# Patient Record
Sex: Male | Born: 1980 | Hispanic: Yes | Marital: Married
Health system: Western US, Academic
[De-identification: ages and names within clinical notes are randomized; demographics above are authoritative.]

---

## 2016-08-07 ENCOUNTER — Telehealth (INDEPENDENT_AMBULATORY_CARE_PROVIDER_SITE_OTHER): Payer: Self-pay

## 2016-08-07 NOTE — Telephone Encounter (Signed)
New patient screening form for [facility] has been uploaded to Media. Please review and advise on scheduling.        Ernest Hernandez  [facility]

## 2016-08-08 NOTE — Telephone Encounter (Signed)
Called patient to schedule an appointment for [facility] approved by Courtney Sanchez left vm to call us back.    Thank You,  Sonia Martinez

## 2016-08-23 ENCOUNTER — Encounter (INDEPENDENT_AMBULATORY_CARE_PROVIDER_SITE_OTHER): Payer: TRICARE Prime—HMO

## 2016-08-23 NOTE — Interdisciplinary (Signed)
Psychiatric Intake    ID: Dionisios is a 35 year old, married, heterosexual, employed, active duty, [place] male with no significant psychiatric history.     HPI: Tadan presents to [facility] seeking medication management services; he was referred by Dr. Angelene Giovanni at the [address] [facility]. CLT reports a couple weeks ago, he was driving to [address] from [facility] after dropping his son off at preschool, and realized he no longer wants to be in the [employer]. He feels being in the [employer] is preventing him from completing school and caring for his family after he retires. Dr. Angelene Giovanni informed CLT he may have ADD, and referred him out for an evaluation and treatment. CLT reports he has always struggled to perform well in school and has difficulty focusing.    Chief Complaint: "Dr. Angelene Giovanni referred me here for an ADD evaluation."     Current Psychosocial Stressors: CLT has recently realized he does not want to "put in any effort" while he stays in the [facility], that he is only staying in to get to 20 years so his family can stay on healthcare. CLT is enrolling in school, but worried about how his performance will be.     CURRENT SYMPTOMS:  Depression: CLT reports he feels "happy" most of the time, but reports he has been less interested in going to the movies with his family, going to the park, and being around family members. Denies SI/plan/intent.  Mania: Denied.  Anxiety: CLT reports a lot of his anxiety is related to the symptoms he has experienced throughout his life (see ADD symptoms below). Over the last 2-3 years, he has been sleeping an average of 4-5 hours per night, waking up at midnight (after going to bed at 8 PM) and being unable to fall asleep again. CLT reports he may have had a panic attack in August 2016 when he experienced sweating, heart pounding, shortness of breath, chest pain, and fear something "was wrong"; he went to the ED and was told it may have been from anxiety since they did not  find anything wrong.  OCD: Denied.  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 CLT reports he has "worked through" a lot of his symptoms, no longer experiencing nightmares and having "less vivid" memories; "I still remember faces an injuries, but I don't remember all the details anymore. He does not believe PTSD is contributing to his current symptoms.  Eating Disorders: Denied.  Adjustment Disorder: CLT reports he has been experiencing symptoms throughout his life (which Dr. Angelene Giovanni labeled as possible ADD), but that recently he has been considering leaving in the [facility]. Will have to further assess.  ADD: CLT reports he has experienced these symptoms throughout his life, but had never considered they could be ADD until he met with Dr. Angelene Giovanni. He reports he grew up thinking he had a learning disability due to having to re-read things in school in order to comprehend "things that were easy for everyone else". He reports his mind wanders a lot, he has to write down things at work in order to keep track of tasks he has to do (carries a notebook with him at work due to getting in trouble in the past), is easily distracted, and that his wife gets upset with him "because I will be  looking at her but not comprehending what she is saying to me."      PAST PSYCHIATRIC HISTORY   Psychiatrist: Denied.  Previous Diagnoses: Denied.  Inpatient Hospitalizations: Denied. Reports he went to the ED 1x in August 2016 due to chest pain, but was told it was likely a panic attack.  Suicide Attempts: Denied.   Outpatient Care: CLT was seen 2-3x by a psychiatrist in the past for a PTSD evaluation.   Past Psychiatric Medication Trials: Denied.      ALCOHOL AND OTHER DRUG HISTORY   TOBACCO: Denied.  ALCOHOL: Drinks 2x/week, usually 2 beers.  IV DRUGS: Denied.  AMPHETAMINES:  Denied.  CAFFEINE: Denied.  CANNABIS: Denied.   COCAINE: Denied.  HALLUCINOGENS: Denied.   INHALANTS: Denied.  OPIOIDS: Denied.  PCP: Denied.   SEDATIVE / HYPNOTICS: Denied.  ECSTASY: Denied.   GHB: Denied.  KETAMINE: Denied.  SOBRIETY AND TREATMENTS: N/A     MEDICAL HISTORY: Ongoing pain in his legs from stress fractures.   Medications/supplements: Motrin and Tylenol, organic protein.     SOCIAL HISTORY   RELATIONSHIPS: CLT has been married for 11 years, and they have 4 children together (sons ages 13, 19, and 35, and a daughter age 75).  SOCIAL SUPPORT: CLT reports his family is his main support system.  EDUCATION: Graduated high school, reports he did not do well. Some college credits and completed EMT basic training.  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  ABUSE/VIOLENCE: Denied.     Family/Social History: CLT was born in [place], grew up in the [place] area until he was 7, then moved to [place], [place]. He has an older brother and older sister, and a younger sister. His parents are still together, and live in [place] ([place] remains close to them). CLT reports his childhood was good, and denies experiencing emotional, physical, or sexual abuse. CLT has been married for 11 years, and has 4 children (ages 57, 23, 38, and 1). CLT was raised [place], but does not currently identify with any religion.    Family Psych History:  Half-sister - Schizophrenia and depression  Brother - alcohol and drug use problem    MILITARY HISTORY: Dorwin has been in the [employer] for 14 years, and holds the rank of E6. He is currently stationed at [address], and he works at a clinic; he does Data processing manager work, Human resources officer. 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  He has no history  of disciplinary action or FAP cases. His current contract ends in December 2018, but he plans to stay in for 20 years.  Firearms: No access to weapons/live fire at work. No personal firearms.    Mental Status Exam: Timm is a [place] male. He was 5 mins late for his appointment, engaged in session, appeared Ox4. Average height, slightly overweight, casually dressed. Appears stated age. No psychomotor abnormalities noted. Mood: "happy overall" with euthymic affect. Speech: Normal RRTV. TC: negative for SI/HI/AVH/delusions/paranoia. TP: LLGD. Insight/judgment: good/good.     Assessment: Gavinn is a 35 year old [place] American male with Unspecified Anxiety DO (rule out ADD, rule out Adjustment Disorder). CLT has a family history of mental illness which could indicate a biological predisposition to his symptoms. His symptoms have occurred  throughout the course of his life, but have worsened in the context of concern about his future and caring for his family. CLT has good insight into his symptoms and their impact on his daily functioning.    Plan: CLT is scheduled for medication management services with Dr. Leana Roe on 08/24/16. Reviewed with CLT when to call 911/go to ED. Patient was counseled and understands he can call 911 or go to any emergency room if he is in psychiatric crisis.  All risks/benefits/alternatives of treatment at [facility] discussed and agreed to. CLT is in agreement with current plan.

## 2016-08-24 ENCOUNTER — Ambulatory Visit (INDEPENDENT_AMBULATORY_CARE_PROVIDER_SITE_OTHER): Payer: TRICARE Prime—HMO | Admitting: Student in an Organized Health Care Education/Training Program

## 2016-08-24 VITALS — BP 129/78 | HR 57 | Resp 16

## 2016-08-24 DIAGNOSIS — F4321 Adjustment disorder with depressed mood: Secondary | ICD-10-CM

## 2016-08-24 NOTE — Progress Notes (Signed)
Attending Psychiatrist Attestation Note 08/24/16    This psychiatric service is being provided under [facility]'s Resident Supervisory Model. Under this model, a fully licensed Psychiatry Resident Physician (MD/DO) is providing medical service under a supervising Attending Psychiatrist. The Supervisory Model is a team approach in an academic center which provides comprehensive and empirically supported care to patients. Under this model of care, I have access to the medical records, and was available on-site to discuss the patient visit with the resident and to see the patient if indicated.     I have seen this patient and have performed the critical elements of the history, presentation, progress, diagnosis and mental status examination in order to confirm the resident physician's findings. I have reviewed the history, presentation, progress  and diagnosis of this patient with the resident physician. I participated in the medical decision making with the resident physician.  I agree with the findings and I agree with the plan as documented.        Anne Bird, MD  Attending Staff Psychiatrist  [facility] outpatient services

## 2016-08-24 NOTE — Progress Notes (Signed)
PGY-3 Psychiatry Resident Intake Appointment    This psychiatric service is being provided under [facility]'s Resident Supervisory Model. Under this model, a fully licensed Psychiatry Resident Physician (MD/DO) is providing medical service under a supervising Attending Psychiatrist. The Supervisory Model is a team approach in an academic center which provides comprehensive and empirically supported care to patients. Under this model of care, a supervising Attending Psychiatrist oversees care, accesses medical records, is available on-site in the clinic for consultation with the Psychiatry Resident Physician, and, as indicated may see the patient in person.    Patient name: Ernest Hernandez  Date:  08/24/16  Time: 90 minutes    Clinic: [facility]  Patient referred by: Dr. Nicoletta Ba     HISTORY     Chief Complaint: "i'm not sure. I was referred here by Dr. Nicoletta Ba at a [facility] branch clinic and he believes I may have ADD."  HPI: Ernest Hernandez is married with children (very supported), [employer], 35 year old male with no formal psychiatric history who is presenting for the abovbe chief complaint.   Patient is a good historian.    He went to the clinic bc he was feeling depressed and hopeless about the future. He's been in [employer] for 14 yrs. 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 Since being in a leadership role in 2008, he's been selfless and taking care of other people. He has not beeen able to take college classes. He feels depressed. He has anxiety. "never been diagnosed with it." his wife said she'd support pt in getting of military. But the pt decided to stay in the military for another 5 yrs to obtain the benefits for his family. He appealed to his chain of command to take some  responsibility off of his shoulders. He cried in front of his chief. That caused the referral to Dr. Nicoletta Ba. This all happened in August. He's felt like this for a long time. Before, they were more like passig thoughts until it all hit him in August. People see him as overly reliable. He's always the person that others go to because he does his job and does it right, so he gets tons of responsibility and ends up not being able to take care of himself. There are other people at his rank that don't get as much responsibility bc they're not as reliable. Before August, overall he felt happy at work. Now, he still puts on a happy face but internally feels bothered.     Ever since he was a kid, 2nd grade, he had difficulty in school. He had no reading comprehension. He believed he had a learning disability. He never remembered anything he read. He was in the slow reading classes in school. He's still a slow reader. Still has poor comprehension. He has to re-read multiple times today and in elementary school. His mind is wandering all the time. At work, he often takes 3X longer to do something than his peers when it comes to writing and reading. So he feels he has to put in lots of extra effort. He's always the last to finish exams. Grades in elementary school - C's and D's. Almost didn't graduate high school bc didn't pass an [place] course. He  felt that math was easy for him and he was confident in math. As a kid, his mind would sometimes wander when he watched TV. Even now, his mind wanders when he watches TV.     His wife yells at him a lot. When they're having a conversation, he often looks at her, hears the words, but doesn't retain / follow. This makes her very angry. This is not intentional. That's been going on the whole 11 yrs of their marriage. She assumes he's being lazy and not listening.   His father would often yell at him for being distractable as a kid when they were working together.   Words on a page do  not appeared jumbled or rearranged. He just can't comprehend.   He always carries a notebook. He writes everything down. He cannot do almost anything by memory alone.   Even when watching movies, he loses focus. He   Last time he felt happy was before [place], in 2004. He was a changed man after coming back. He stopped calling his mother. He's felt unhappy and anxious since then. He saw a psychiatrist 2-3x for "PTSD." he's been happy about 70-80% of the time happy after [place]. He still feels happy about "high 80s"% of the time.   No nightmares. No flashbacks. Does have memories, passing, not intrusive. He feels uncomfortable in public places. He feels "suffocated and over-crowded" iin public places. But not when he's with family.   Sleep; does breathing exercises before bedtime. Was previously sleeping 3-4hrs/night, regardless of his mood. He now sleeps about 6-7hrs/night.   Anhedonia; still actively enjoys fishing and camping and being with his wife. Enjoys being with his kids and his wife. They make his bad days better.   Appetite; good.   He was a quiet kid. Real mild. Not hyperactive.   Denies SI/HI/AVH. Denies any target sx of mania/hypomania.     History of congenital heart disease: denies   History of sudden cardiac death or early heart disease in family: denies   Any history of stimulant use/abuse: denies   Any history of high blood pressure: denies   Any history of heart disease in the patient: denies     The following is adapted and updated from Bethann Berkshireourtney Sanchez' LCSW navy intake note..I have verified this history, to the best of the patient's ability, during this encounter.         PAST PSYCHIATRIC HISTORY   Psychiatrist: Denied.  Previous Diagnoses: Denied.  Inpatient Hospitalizations: Denied. Reports he went to the ED 1x in August 2016 due to chest pain, but was told it was likely a panic attack.  Suicide Attempts: Denied.   Outpatient Care: CLT was seen 2-3x by a psychiatrist in the past for a PTSD  evaluation.   Past Psychiatric Medication Trials: Denied.     ALCOHOL AND OTHER DRUG HISTORY   TOBACCO: Denied.  ALCOHOL: Drinks 2x/week, usually 2 beers.  IV DRUGS: Denied.  AMPHETAMINES: Denied.  CAFFEINE: Denied.  CANNABIS: Denied.   COCAINE: Denied.  HALLUCINOGENS: Denied.   INHALANTS: Denied.  OPIOIDS: Denied.  PCP: Denied.   SEDATIVE / HYPNOTICS: Denied.  ECSTASY: Denied.   GHB: Denied.  KETAMINE: Denied.  SOBRIETY AND TREATMENTS: N/A    MEDICAL HISTORY: Ongoing pain in his legs from stress fractures.   Medications/supplements: Motrin and Tylenol, organic protein.    SOCIAL HISTORY   RELATIONSHIPS: CLT has been married for 11 years, and they have 4 children together (sons ages 599, 726, and  4, and a daughter age 70).  SOCIAL SUPPORT: CLT reports his family is his main support system.  EDUCATION: Graduated high school, reports he did not do well. Some college credits and completed EMT basic training.  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  ABUSE/VIOLENCE: Denied.    Family/Social History: CLT was born in [place], grew up in the [place] area until he was 7, then moved to [place], [place]. He has an older brother and older sister, and a younger sister. His parents are still together, and live in [place] (CLT remains close to them). CLT reports his childhood was good, and denies experiencing emotional, physical, or sexual abuse. CLT has been married for 11 years, and has 4 children (ages 72, 22, 68, and 1). CLT was raised [place], but does not currently identify with any religion.    Family Psych History:  Half-sister - Schizophrenia and depression  Brother - alcohol and drug use problem  No history of ADD.     MILITARY HISTORY: Daksh has been in the [employer] for 14 years, and holds the rank of E6. He is currently stationed at [address], and he works at a clinic; he does Designer, industrial/product work, Surveyor, minerals. He has been on 5 deployments, spending 7 months on the ground  in [place] in 2004 (seeing and treating casualties), 5 months at  [employer] treating prisoners, on the ground in [place] in 2010, and on the ship in [place] and the [place] previously.  He has no history of disciplinary action or FAP cases. His current contract ends in December 2018, but he plans to stay in for 20 years.  Firearms: No access to weapons/live fire at work. No personal firearms.      PMH: high cholesterol, MSK leg pain   There is no problem list on file for this patient.      PSH:  No past surgical history on file.    Current medications: denies    No current outpatient prescriptions on file prior to visit.     No current facility-administered medications on file prior to visit.      Allergies: NKDA   Allergies not on file    Review of Systems: See HPI. Otherwise, complete 10 point review of systems was negative.     PSYCHIATRIC SPECIALTY EXAMINATION     Vital Signs:   Blood pressure 129/78, pulse 57, resp. rate 16, SpO2 97 %.    Psychometric Scales:  No flowsheet data found.  No flowsheet data found.    Mental Status Examination:   Appearance: well-kempt, hygienic, appears stated age     Behavior: calm, cooperative     Motor/Abnormal Involuntary Movements: none     Gait: No abnormality     Speech: fluent, articulate, spontaneous, somewhat monotone    Mood: " fine "   Affect: constricted    Thought Process: linear     Associations: logical      Thought Content: denies SI/HI    Perceptions: denies AVH    Insight/Judgment: fair / grossly unimpaired presently     Orientation: A/O x4    Memory: recent and remote memory grossly intact    Attention/Concentration: no gross abnormalities    Language:  average based on verbal fluency and interaction    Fund of knowledge and Intellect: average based on interview and verbal fluency      Pertinent Studies/Labs: none      MEDICAL DECISION MAKING  Assessment:   Tyus Kallam is married with children (very supported), [employer], 35 year old male with  no formal psychiatric history who is presenting for the abovbe chief complaint.     Attention difficulty. Perhaps a learning disability, of which poor focus is a consequence. Can't r/o ADD. Needs neuropsych testing.   Despite endorsing that he's happy 70-80% of the time, he seems constricted and his wife has mentioned that he seems emotionally numb. He learned to turn down emotions in [place] in order to function optimally and seems to still be operating in this manner. He opened up about his military experience today and was tearful for the 3rd time in 13 years. He also has significant work-related stress which peaked in August. He feels trapped in [employer] and committed to another, long 5 yrs in order for his family to reap the benefits.     No e/o psychosis, bipolarity, major depression, GAD, social anxiety, or personality disorder.     A comprehensive suicide risk assessment was performed and the patient was assessed to be at a low imminent risk of self-harm.  Modifiable risk factors include n/a.  Non-modifiable risk factors include male gender.  The patient also has protective factors of future life plans, coping skills, therapeutic relationships, responsibility to children and access to health care.    Diagnostic Impression:  Adjustment disorder with depression   R/o ADD, r/o learning disability   Does not meet full criteria for PTSD but may have related symptoms   HLD    Plan/Recommendations:  Intervention/Psychotherapy: supportive, empathic listening, will place on resident therapy wait list   Medication:   Pending further evaluation.   Labs/Radiology/Tests/Consultation: neuropsychological testing   Other: none     Informed Consent/Confidentiality: R/B/A and possible ASEs of prescribed treatments were discussed with the patient who consented to the treatment plan.  Limits of confidentiality were reviewed at the beginning of today's intake appt.     Disposition: Return Visit     Ashley Murrain, MD  [facility]    This psychiatric service has been provided under [facility]'s Resident Supervisory Model. The care has been discussed and the note reviewed by the supervising Attending Psychiatrist: Dr. Egbert Garibaldi. Please refer to attached addendum for the supervisor's attested note.

## 2016-08-28 ENCOUNTER — Encounter (HOSPITAL_BASED_OUTPATIENT_CLINIC_OR_DEPARTMENT_OTHER): Payer: Self-pay | Admitting: Student in an Organized Health Care Education/Training Program

## 2016-08-28 DIAGNOSIS — F09 Unspecified mental disorder due to known physiological condition: Secondary | ICD-10-CM

## 2016-08-28 DIAGNOSIS — F068 Other specified mental disorders due to known physiological condition: Secondary | ICD-10-CM

## 2016-08-28 DIAGNOSIS — F32A Depression, unspecified: Secondary | ICD-10-CM

## 2016-08-28 DIAGNOSIS — F329 Major depressive disorder, single episode, unspecified: Secondary | ICD-10-CM

## 2016-09-24 ENCOUNTER — Ambulatory Visit (INDEPENDENT_AMBULATORY_CARE_PROVIDER_SITE_OTHER): Payer: TRICARE Prime—HMO | Admitting: Student in an Organized Health Care Education/Training Program

## 2016-09-24 VITALS — BP 116/77 | HR 66 | Resp 16 | Wt 191.0 lb

## 2016-09-24 DIAGNOSIS — F4321 Adjustment disorder with depressed mood: Secondary | ICD-10-CM

## 2016-09-24 NOTE — Progress Notes (Signed)
PGY-3 Psychiatry Resident Follow Up Appointment    This psychiatric service is being provided under [place]'s Resident Supervisory Model. Under this model, a fully licensed Psychiatry Resident Physician (MD/DO) is providing medical service under a supervising Attending Psychiatrist. The Supervisory Model is a team approach in an academic center which provides comprehensive and empirically supported care to patients. Under this model of care, a supervising Attending Psychiatrist oversees care, accesses medical records, is available on-site in the clinic for consultation with the Psychiatry Resident Physician, and, as indicated may see the patient in person.    Patient name: Ernest Hernandez  Date: 09/24/16  Time: 60 minutes   Clinic: [facility]     HISTORY     Chief Complaint: irritability     HPI:  Ernest Hernandez is married with children (very supported), [employer], 35 year old male with adjustment disorder who is presenting for the abovbe chief complaint.     Pt has been very irritable at work lately. His workload has increased, despite him trying to advocate for himself to have it decreased. He is finding it difficult to get his mind off of work even at home. Denies significant anxiety. Mood has been a bit lower as well but the moment he enters his home and sees his children, his spirits lift and he's able to enjoy the daily family bike ride. Relationship with wife is stable. Pt's work day starts at 6:30am and ends at 4pm. Pt gets out by 4:15pm most days. Despite this, he continues to endorse problems with concentration. He is still very interested in therapy. No major issues with sleep.     Interal substances, alcohol, tobacco: denies  Review of Systems Update: See HPI. Otherwise, patient denies:  chest pain, dyspnea, palpitations, rash, unintentional weight loss / gain, constitutional symptoms.       Past Psychiatric, Family, & Social History Updates: See HPI. No additional changes.   Medications:  No  current outpatient prescriptions on file prior to visit.     No current facility-administered medications on file prior to visit.      Past Medical History:  Patient Active Problem List   Diagnosis   . Adjustment disorder with depressed mood     Allergies:   No Known Allergies  PSYCHIATRIC SPECIALTY EXAMINATION     Vital Signs:    Blood pressure 116/77, pulse 66, resp. rate 16, weight 86.6 kg (191 lb), SpO2 97 %.    Psychometric Scales:  [place] PHQ9 DEPRESSION QUESTIONNAIRE 08/24/2016   Interest 1   Depressed 1   Sleep 2   Energy 1   Appetite 2   Failure 1   Concentration 3   Movement 0   Suicide 0   Summary(Manual) --   Summary(Calculated) 11   Functional Somewhat difficult     GAD 7 08/24/2016   Feeling afraid as if something awful might happen 0   Being easily annoyed or irritable 1   Worrying too much about different things 1   If you checked off any problems, how difficult have these problems made it for you to do your job along with other people? Somewhat difficult   Feeling nervous, anxious or on edge 1   Trouble relaxing 1   Being so restless that it is hard to sit still 0   GAD7 Patient Total 5   Not being able to stop or control worrying 1       Mental Status Examination:   Appearance: well-kempt, hygienic, appears stated  age    Behavior: calm, cooperative    Motor/Abnormal Involuntary Movements: none    Gait: No abnormality    Speech: fluent, articulate, spontaneous   Mood: " irritable "   Affect: congruent but pleasant with author    Thought Process: linear    Associations: logical     Thought Content: denies SI/HI   Perceptions: denies AVH   Insight/Judgment: fair / grossly unimpaired presently    Orientation: A/O x4   Memory: recent and remote memory grossly intact   Attention/Concentration: no gross abnormalities   Language:  average based on verbal fluency and interaction   Fund of knowledge and Intellect: average based on interview and verbal fluency     Pertinent Studies/Labs: None.      MEDICAL DECISION  MAKING     Assessment:   Ernest Hernandez is married with children (very supported), [employer], 35 year old male with adjustment disorder who is presenting for the abovbe chief complaint.     Ongoing mood and irritability all related to [employer]. His mood lifts when he's at home. His relationships are stable. Still suspect adjustment disorder. He endorses difficulty with concentration but is getting out of work on time and getting to work on time. Still, given the lifelong history of cognitive sx, it's still indicated to pursue neuropsych testing for ADD vs learning disability. In the meantime, psychotherapy is indicated for the adjustment disorder, not medications. Pt is currently not scheduled to see a therapist until late December. I will see him for supportive psychotherapy in the meantime given the acute work stress and distress It is causing.     A comprehensive suicide risk assessment was performed and the patient was assessed to be at a low acute risk of self-harm.  Modifiable risk factors include n/a.  Non-modifiable risk factors include existing psychiatric diagnoses.  The patient also has protective factors of future life plans, coping skills, therapeutic relationships, responsibility to children and access to health care.    Diagnostic Impression:  Adjustment disorder with depression   R/o ADD, r/o learning disability   Does not meet full criteria for PTSD but may have related symptoms   HLD      Problem/Condition:  Clinical trajectory / status: stable, not at goal    Comorbidities: none interfering with tx     Plan/Recommendations:  Intervention/Psychotherapy: supportive, t will see author for psychotherapy in 2 wks   Medication:   None indicated     Labs/Radiology/Tests/Consultation: none    Other: none      Informed Consent/Confidentiality: R/B/A and possible ASEs of prescribed treatments were discussed with the patient who consented to the treatment plan.  Limits of confidentiality have previously  been reviewed.   Disposition: RTC 2 wks      Ashley Murrain, MD  [facility]    This psychiatric service has been provided under [place]'s Resident Supervisory Model. The care has been discussed and the note reviewed by the supervising Attending Psychiatrist: Dr. Chestine Spore. Please refer to attached addendum for the supervisor's attested note.    Attending Psychiatrist Attestation Note 09/24/16    This psychiatric service is being provided under [place]'s Resident Supervisory Model. Under this model, a fully licensed Psychiatry Resident Physician (MD/DO) is providing medical service under a supervising Attending Psychiatrist. The Supervisory Model is a team approach in an academic center which provides comprehensive and empirically supported care to patients. Under this model of care, I have access to the medical records, and was available on-site  to discuss the patient visit with the resident and to see the patient if indicated. Please see below for specific comments on the service being provided at today's session.    Additional comments:   I have reviewed the history, presentation, progress, and diagnosis, with the resident physician. I agree with the findings and plan as documented with additional comments made below, and I participated in the medical decision making with the resident physician.    Floy SabinaAshley Clark, MD  Attending Staff Psychiatrist  [facility]

## 2016-10-18 ENCOUNTER — Ambulatory Visit (INDEPENDENT_AMBULATORY_CARE_PROVIDER_SITE_OTHER): Payer: TRICARE Prime—HMO | Admitting: Student in an Organized Health Care Education/Training Program

## 2016-10-18 DIAGNOSIS — F5101 Primary insomnia: Secondary | ICD-10-CM

## 2016-10-18 DIAGNOSIS — F4321 Adjustment disorder with depressed mood: Secondary | ICD-10-CM

## 2016-10-18 MED ORDER — DOXEPIN HCL 10 MG OR CAPS
10.00 mg | ORAL_CAPSULE | Freq: Every evening | ORAL | 0 refills | Status: DC | PRN
Start: 2016-10-18 — End: 2016-12-28

## 2016-10-18 NOTE — Progress Notes (Signed)
PGY-3 Psychiatry Resident Follow Up Appointment    This psychiatric service is being provided under [place]'s Resident Supervisory Model. Under this model, a fully licensed Psychiatry Resident Physician (MD/DO) is providing medical service under a supervising Attending Psychiatrist. The Supervisory Model is a team approach in an academic center which provides comprehensive and empirically supported care to patients. Under this model of care, a supervising Attending Psychiatrist oversees care, accesses medical records, is available on-site in the clinic for consultation with the Psychiatry Resident Physician, and, as indicated may see the patient in person.    Patient name: Ernest Hernandez  Date: 10/18/16  Time: 60 minutes   Clinic: [facility]     HISTORY     Chief Complaint: medication management     HPI:   Ernest Hernandez with children (very supported), [employer] [phone],35 year oldmale with adjustment disorder who is presenting for the abovbe chief complaint.     Interval History   Work situation has improved. 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 Other co-workers now forced to pick up slack --> less pressure and work on patient. Irritability has improved significantly. Denies target sx of depression. 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 He's looking forward to this. Should still be challenging, but in a good way. Overall should be less work. Things going really well at home. Pt does endorse 2-3x/wk early insomnia, waking at 2/3AM and being unable to fall back to sleep. He is also unable to sleep in past 4:30am on the weekends. He is able to fall asleep easily. His energy level is "okay." he does not fall asleep in the middle of hte day.   Interal substances, alcohol, tobacco: denies   Review of Systems Update: See HPI. Otherwise, patient denies:  chest pain, dyspnea, palpitations, rash, unintentional weight loss / gain, constitutional symptoms.       Past Psychiatric, Family, & Social History Updates:  See HPI. No additional changes.   Medications:  No current outpatient prescriptions on file prior to visit.     No current facility-administered medications on file prior to visit.      Past Medical History:  Patient Active Problem List   Diagnosis   . Adjustment disorder with depressed mood     Allergies:   No Known Allergies  PSYCHIATRIC SPECIALTY EXAMINATION     Vital Signs:    There were no vitals taken for this visit.    Psychometric Scales:  [place] PHQ9 DEPRESSION QUESTIONNAIRE 08/24/2016   Interest 1   Depressed 1   Sleep 2   Energy 1   Appetite 2   Failure 1   Concentration 3   Movement 0   Suicide 0   Summary(Manual) --   Summary(Calculated) 11   Functional Somewhat difficult     GAD 7 08/24/2016   Feeling afraid as if something awful might happen 0   Being easily annoyed or irritable 1   Worrying too much about different things 1   If you checked off any problems, how difficult have these problems made it for you to do your job along with other people? Somewhat difficult   Feeling nervous, anxious or on edge 1   Trouble relaxing 1   Being so restless that it is hard to sit still 0   GAD7 Patient Total 5   Not being able to stop or control worrying 1       Mental Status Examination:   Appearance: well-kempt, hygienic, appears stated age    Behavior:  calm, cooperative    Motor/Abnormal Involuntary Movements: none    Gait: No abnormality    Speech: fluent, articulate, spontaneous   Mood: " good "   Affect: euthymic   Thought Process: linear    Associations: logical     Thought Content: denies SI/HI   Perceptions: denies AVH   Insight/Judgment: good / grossly unimpaired presently    Orientation: A/O x4   Memory: recent and remote memory grossly intact   Attention/Concentration: no gross abnormalities   Language:  average based on verbal fluency and interaction   Fund of knowledge and Intellect: average based on interview and verbal fluency     Pertinent Studies/Labs: None.      MEDICAL DECISION MAKING          Assessment:     Ernest Hernandez with children (very supported), [employer] [phone],35 year oldmale with adjustment disorder who is presenting for the abovbe chief complaint.     Work stress has lessened, as have his primary symptoms of irritability and discontent. No e/o depression. Supports adjustment disorder diagnosis. Pt still endorses cognitive sx, wandering thoughts, poor concentration. However, has not been functionally impaired at work. Neuropsychological testing still pending. Will f/u on this. As for pt's intermittent late insomnia, he endorses not being able to tolerate OTC Benadryl. Doxepin may be of more benefit than trazodone in this case. Pt only anticipates using the medication a few times a week.     A comprehensive suicide risk assessment was performed and the patient was assessed to be at a low acute risk of self-harm.  Modifiable risk factors include none.  Non-modifiable risk factors include existing psychiatric diagnoses and male gender.  The patient also has protective factors of future life plans, coping skills, responsibility to children and access to health care.    Diagnostic Impression:  Adjustment disorder with depression   R/o ADD, r/o learning disability   Does not meet full criteria for PTSD but may have related symptoms   HLD      Problem/Condition:  Clinical trajectory / status: improving    Comorbidities: none interfering with treatment     Plan/Recommendations:  Intervention/Psychotherapy: psycho-education   -Will look into what is delaying the neuropsychological testing   Medication:   -Start doxepin 10 mg PRN insomnia     Labs/Radiology/Tests/Consultation: none    Other: none      Informed Consent/Confidentiality: R/B/A and possible ASEs of prescribed treatments were discussed with the patient who consented to the treatment plan.  Limits of confidentiality have previously been reviewed.   Disposition: RTC 2 months      Ashley Murrainerence Faisal Stradling, MD  [facility]    This  psychiatric service has been provided under [place]'s Resident Supervisory Model. The care has been discussed and the note reviewed by the supervising Attending Psychiatrist: Dr. Nelta NumbersAshbrook. Please refer to attached addendum for the supervisor's attested note.

## 2016-10-18 NOTE — Progress Notes (Signed)
Attending Psychiatrist Attestation Note 10/18/16    This psychiatric service is being provided under [place]'s Resident Supervisory Model. Under this model, a fully licensed Psychiatry Resident Physician (MD/DO) is providing medical service under a supervising Attending Psychiatrist. The Supervisory Model is a team approach in an academic center which provides comprehensive and empirically supported care to patients. Under this model of care, I have access to the medical records, and was available on-site to discuss the patient visit with the resident and to see the patient if indicated. Please see below for specific comments on the service being provided at today's session.    I have reviewed the history, presentation, progress, and diagnosis, with the resident physician. I agree with the findings and plan as documented with additional comments made below, and I participated in the medical decision making with the resident physician.    Additional comments: Agree with resident plan to initiate Doxepin 10mg  HS for insomnia.     Antionette Fairyharles D Ashbrook, MD  Attending Staff Psychiatrist  [facility]

## 2016-11-08 ENCOUNTER — Encounter (INDEPENDENT_AMBULATORY_CARE_PROVIDER_SITE_OTHER): Payer: Self-pay | Admitting: Psychology

## 2016-11-16 DIAGNOSIS — R4189 Other symptoms and signs involving cognitive functions and awareness: Secondary | ICD-10-CM

## 2016-11-16 DIAGNOSIS — F329 Major depressive disorder, single episode, unspecified: Secondary | ICD-10-CM

## 2016-11-16 DIAGNOSIS — F810 Specific reading disorder: Secondary | ICD-10-CM
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
## 2016-11-23 ENCOUNTER — Encounter (INDEPENDENT_AMBULATORY_CARE_PROVIDER_SITE_OTHER): Payer: TRICARE Prime—HMO | Admitting: Marriage & Family Therapist

## 2016-12-07 ENCOUNTER — Encounter (INDEPENDENT_AMBULATORY_CARE_PROVIDER_SITE_OTHER): Payer: TRICARE Prime—HMO | Admitting: Marriage & Family Therapist

## 2016-12-28 ENCOUNTER — Ambulatory Visit (INDEPENDENT_AMBULATORY_CARE_PROVIDER_SITE_OTHER): Payer: TRICARE Prime—HMO | Admitting: Student in an Organized Health Care Education/Training Program

## 2016-12-28 DIAGNOSIS — F819 Developmental disorder of scholastic skills, unspecified: Secondary | ICD-10-CM

## 2016-12-28 NOTE — Progress Notes (Signed)
PGY-3 Psychiatry Resident Follow Up Appointment    This psychiatric service is being provided under [facility] Resident Supervisory Model. Under this model, a fully licensed Psychiatry Resident Physician (MD/DO) is providing medical service under a supervising Attending Psychiatrist. The Supervisory Model is a team approach in an academic center which provides comprehensive and empirically supported care to patients. Under this model of care, a supervising Attending Psychiatrist oversees care, accesses medical records, is available on-site in the clinic for consultation with the Psychiatry Resident Physician, and, as indicated may see the patient in person.    Patient name: Ernest Hernandez  Date: 12/28/16  Time: 60 minutes   Clinic: [facility]     HISTORY     Chief Complaint: medication management, mood symptoms     HPI: 62M [employer], married with children, h/o learning disability and adjustment disorder. Here for the above.     Interval History   Work stress continues to have completely subsided. No target sx of depression or anxiety at all. Marriage and family life are going great. Work is going well, he's employing more boundaries. Completed neuropsych testing. dx'd with mild learning disability. Going to start classes in march. No major issues. He'd like to terminate his treatment here.   Medication side effects: n/a  Interal substances, alcohol, tobacco: denies   Review of Systems Update: See HPI. Otherwise, patient denies:  chest pain, dyspnea, palpitations, rash, unintentional weight loss / gain, constitutional symptoms.       Past Psychiatric, Family, & Social History Updates: See HPI. No additional changes.   Medications:  Current Outpatient Prescriptions on File Prior to Visit   Medication Sig Dispense Refill    doxepin (SINEQUAN) 10 MG capsule Take 1 capsule (10 mg) by mouth At bedtime as needed (insomnia). 30 capsule 0     No current facility-administered medications on file prior to visit.      Past  Medical History:  Patient Active Problem List   Diagnosis    Adjustment disorder with depressed mood     Allergies:   No Known Allergies  PSYCHIATRIC SPECIALTY EXAMINATION     Vital Signs:   Blood pressure 131/73, pulse 57, resp. rate 16, weight 90.7 kg (200 lb).    Psychometric Scales:  [place] PHQ9 DEPRESSION QUESTIONNAIRE 08/24/2016   Interest 1   Depressed 1   Sleep 2   Energy 1   Appetite 2   Failure 1   Concentration 3   Movement 0   Suicide 0   Summary(Manual) --   Summary(Calculated) 11   Functional Somewhat difficult     GAD 7 08/24/2016   Feeling afraid as if something awful might happen 0   Being easily annoyed or irritable 1   Worrying too much about different things 1   If you checked off any problems, how difficult have these problems made it for you to do your job along with other people? Somewhat difficult   Feeling nervous, anxious or on edge 1   Trouble relaxing 1   Being so restless that it is hard to sit still 0   GAD7 Patient Total 5   Not being able to stop or control worrying 1       Mental Status Examination:   Appearance: well-kempt, hygienic, appears stated age    Behavior: calm, cooperative    Motor/Abnormal Involuntary Movements: none    Gait: No abnormality    Speech: fluent, articulate, spontaneous   Mood: " great "   Affect: euthymic  Thought Process: linear    Associations: logical     Thought Content: denies SI/HI   Perceptions: denies AVH   Insight/Judgment: good / grossly unimpaired presently    Orientation: A/O x4   Memory: recent and remote memory grossly intact   Attention/Concentration: no gross abnormalities   Language:  average based on verbal fluency and interaction   Fund of knowledge and Intellect: average based on interview and verbal fluency     Pertinent Studies/Labs: None.      MEDICAL DECISION MAKING     Assessment:   Adjustment disorder as completely subsided. No e/o depression / anxiety. Psychosocially and relationally thriving. Neuropsychological testing confirmed  diagnosis of mild learning disability, with recommendations for accommodations elaborated in the note. This confirms author's clinical suspicion of learning disability. ADHD less likely. Patient would like to terminate treatment here, and author agrees that this is reasonable. We discussed his new diagnosis of learning disability and advocating for accommodations (testing, classroom) when he begins college classes.     A comprehensive suicide risk assessment was performed and the patient was assessed to be at a low acute risk of self-harm.  Modifiable risk factors include none.  Non-modifiable risk factors include male gender.  The patient also has protective factors of future life plans, coping skills, responsibility to children and access to health care.    Problem/Condition:  Clinical trajectory / status: stable, at goal    Comorbidities: no medical co-morbidities interfering with psychiatric treatment.     Diagnostic Impression:   Learning disability   Adjustment disorder with mixed anxiety/ depression - resolved     Plan/Recommendations:  Patient self-DC'd doxepin     Labs/Radiology/Tests/Consultation: none   Other: none     Risks / benefits / alternatives of the treatment plan including common and uncommon side-effects of medications were reviewed with the patient. The patient was able to repeat this information and consents to the treatment plan outlined above. The patient has decisional capacity to understand this RBA and give consent.    The patient understands they can contact OPS with non-urgent questions or concerns. The patient understands they can call 911 or go to an Emergency Department in the event of a psychiatric emergency.     Informed Consent/Confidentiality: R/B/A and possible ASEs of prescribed treatments were discussed with the patient who consented to the treatment plan.  Limits of confidentiality have previously been reviewed.   Disposition: terminating treatment at [facility]PS      Ashley Murrainerence Jayni Prescher,  MD  [facility]    This psychiatric service has been provided under [facility] Resident Supervisory Model. The care has been discussed and the note reviewed by the supervising Attending Psychiatrist: Dr. Egbert GaribaldiBird. Please refer to attached addendum for the supervisors attested note.

## 2016-12-28 NOTE — Progress Notes (Signed)
Attending Psychiatrist Attestation Note 12/28/16    This psychiatric service is being provided under [facility]’s Resident Supervisory Model. Under this model, a fully licensed Psychiatry Resident Physician (MD/DO) is providing medical service under a supervising Attending Psychiatrist. The Supervisory Model is a team approach in an academic center which provides comprehensive and empirically supported care to patients. Under this model of care, I have access to the medical records, and was available on-site to discuss the patient visit with the resident and to see the patient if indicated.           Ernest Balan, MD  Attending Staff Psychiatrist  [facility] outpatient services

## 2020-10-21 IMAGING — MR MRI HIP LT WO CONTRAST
6 series · 40 of 40 positions shown · non-contrast
Comparison: None.

INDICATION: Pain in left hip.
TECHNIQUE: Multiplanar, multiecho imaging of the left hip was performed, including T1-weighted and fluid sensitive sequences without intravenous contrast.

[Series 1: labral · axial · left · 6.0mm · 0.78mm/px · z∈[-36,+200]mm · 2 of 12 slices shown]
[im 1/12]
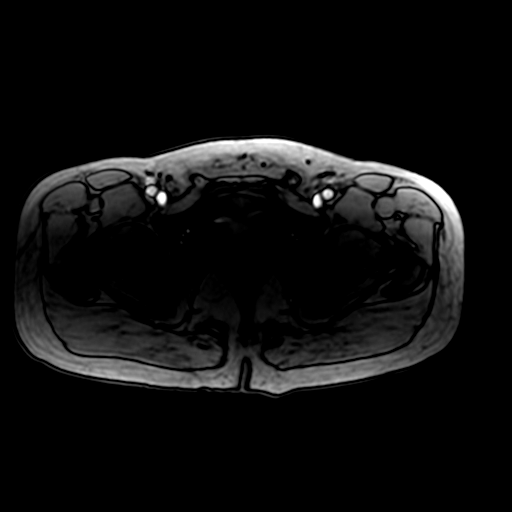
[im 12/12]
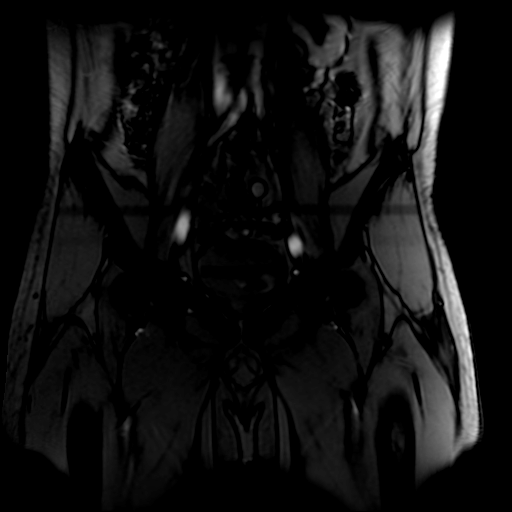

[Series 2: t1_cor_obl · coronal · left · 3.0mm · 0.47mm/px · 7 of 28 slices shown]
[im 1/28]
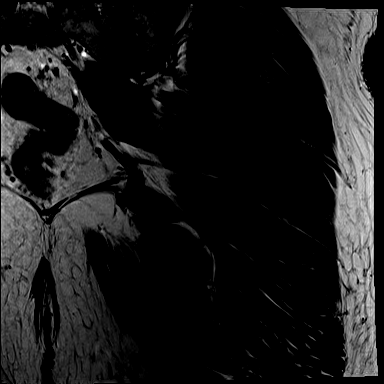
[im 5/28]
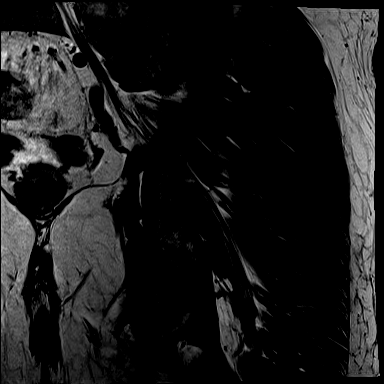
[im 10/28]
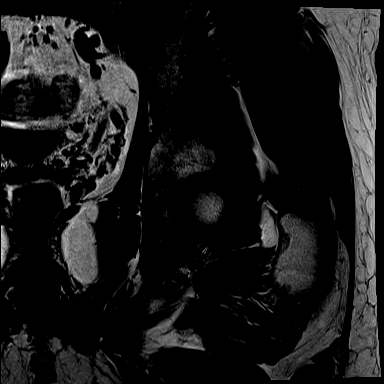
[im 14/28]
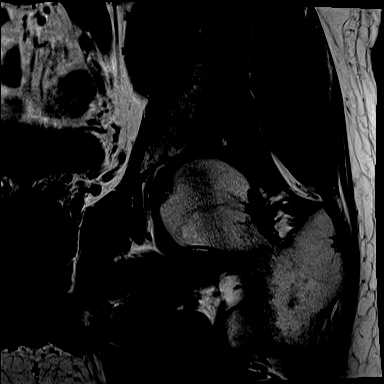
[im 19/28]
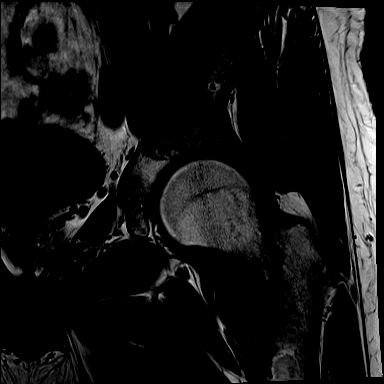
[im 23/28]
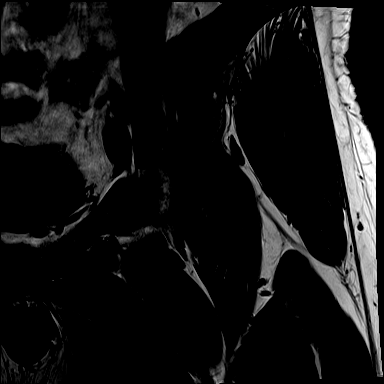
[im 28/28]
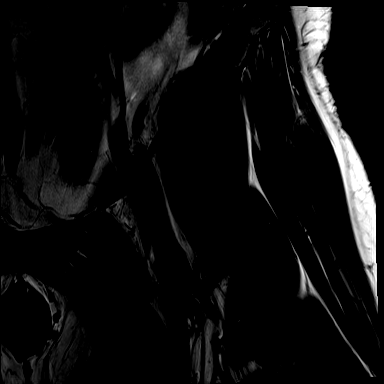

[Series 3: pd_cor_obl_fs · coronal · left · 3.0mm · 0.56mm/px · 7 of 28 slices shown]
[im 1/28]
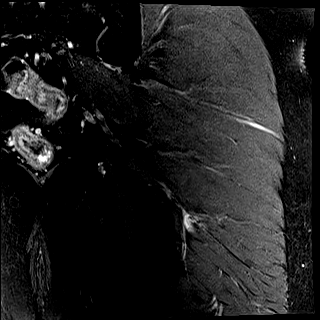
[im 5/28]
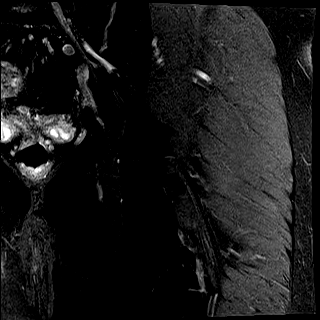
[im 10/28]
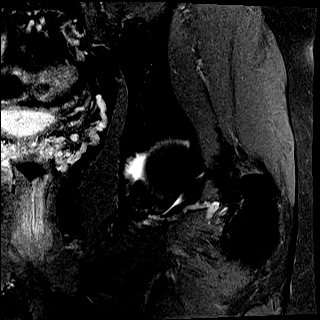
[im 14/28]
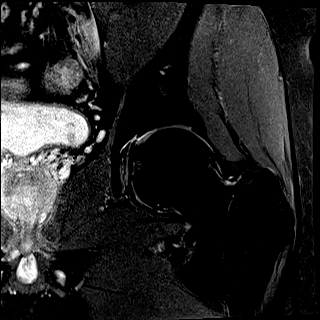
[im 19/28]
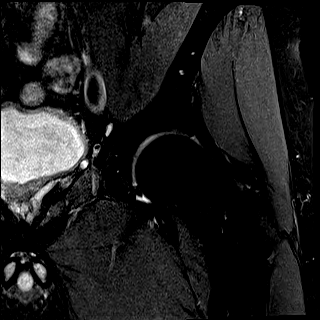
[im 23/28]
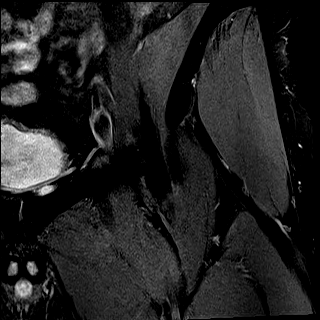
[im 28/28]
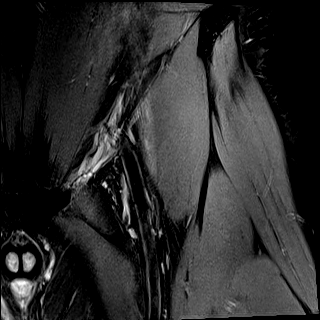

[Series 4: pd_sag_obl_fs · sagittal · left · 3.0mm · 0.56mm/px · 8 of 30 slices shown]
[im 1/30]
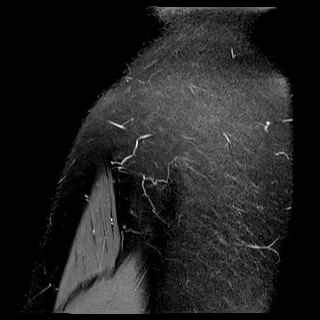
[im 5/30]
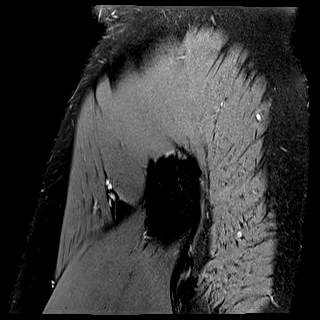
[im 9/30]
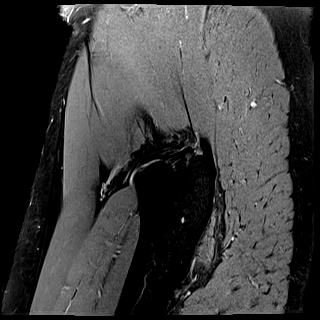
[im 13/30]
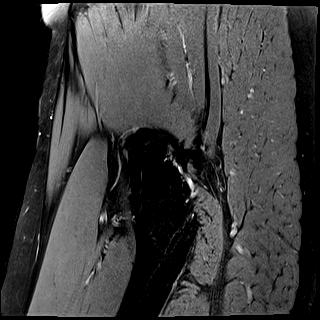
[im 17/30]
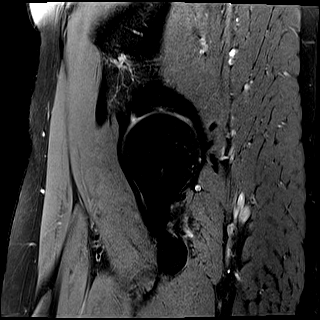
[im 21/30]
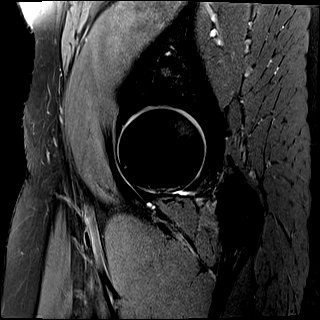
[im 25/30]
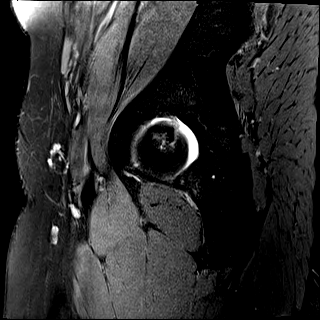
[im 30/30]
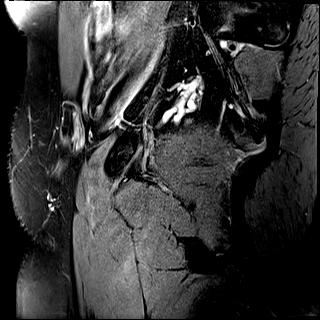

[Series 5: pd_axial_obl_fs · oblique · left · 3.0mm · 0.56mm/px · 6 of 24 slices shown]
[im 1/24]
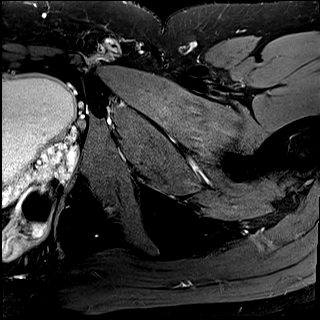
[im 5/24]
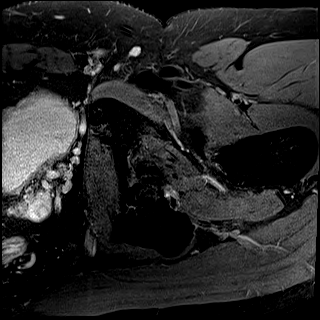
[im 10/24]
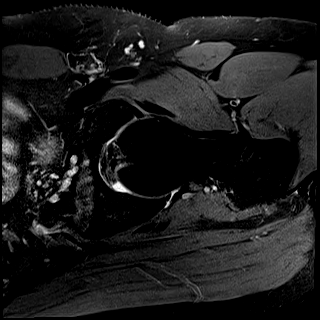
[im 14/24]
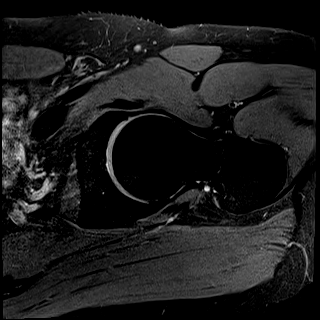
[im 19/24]
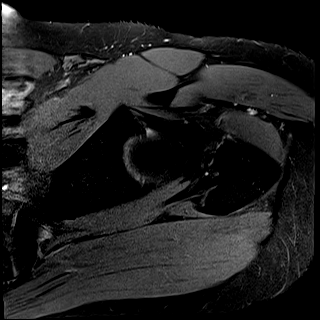
[im 24/24]
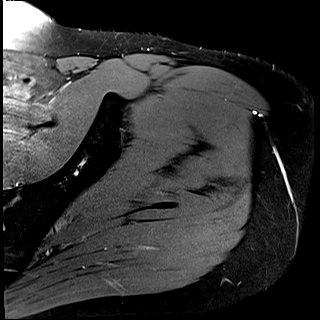

[Series 6: t2_axial_fs · axial · left · 4.0mm · 0.62mm/px · z∈[-105,+80]mm · 10 of 38 slices shown]
[im 1/38]
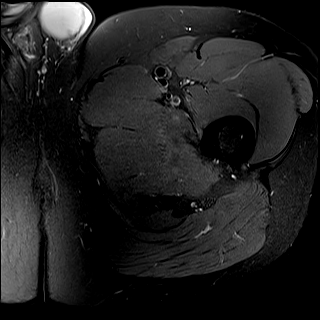
[im 5/38]
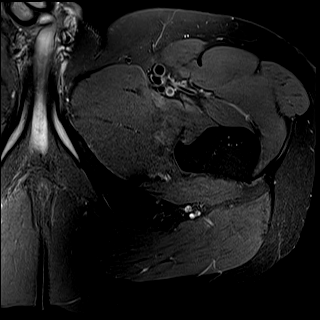
[im 9/38]
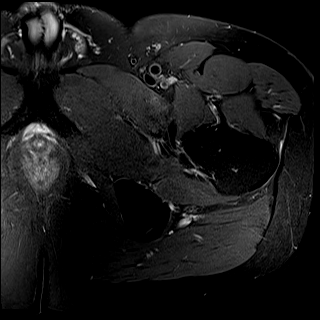
[im 13/38]
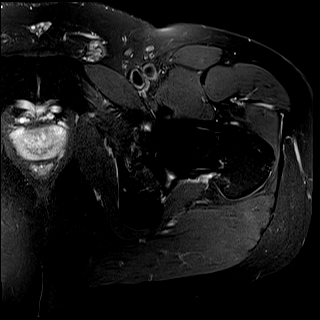
[im 17/38]
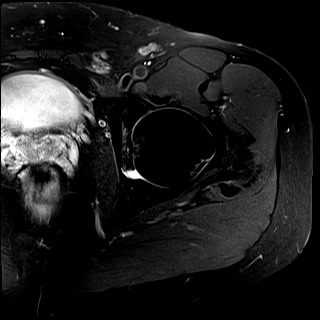
[im 21/38]
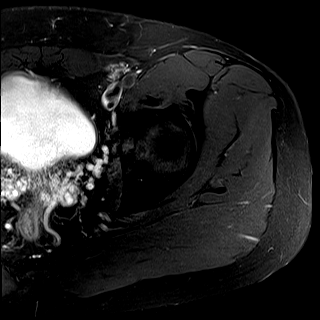
[im 25/38]
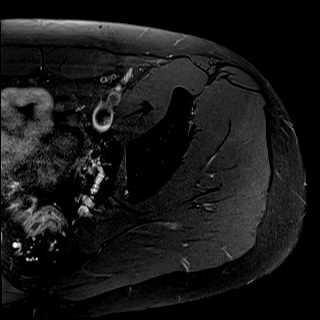
[im 29/38]
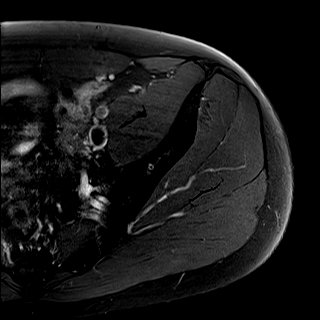
[im 33/38]
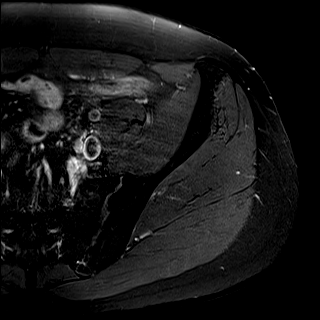
[im 38/38]
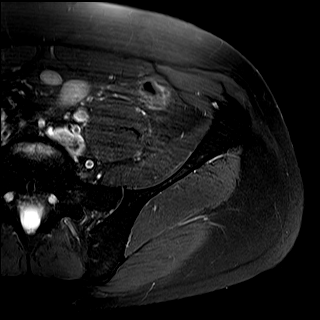

[40 of 40 positions shown; findings below may reference images not displayed]

FINDINGS: Acetabular labrum: Tear of the anterior superior left hip labrum.

Cartilage: Intact

Marrow: Subtle subchondral marrow edema at the posterior superior humeral head. No AVN. No erosion.

Osseous morphology: Cam morphology of the proximal left femur. Normal coverage of the femoral head by the acetabulum. Normal acetabular version.

Gluteal tendons and trochanteric bursa: Intact and normal in appearance.

Other visualized tendons: Other visualized tendons are intact and normal in appearance.

Soft tissues: No fluid collection. No soft tissue mass. No acute muscle injury.

No free fluid in the pelvis. No left inguinal adenopathy or hernia.
IMPRESSION: 1. Tear of the anterior superior left hip labrum.

2. No left hip chondral defects.

3. Mild subchondral marrow edema at the posterior superior humeral head, possible contusion versus stress related.

4. Sugey Kassam morphology of the proximal left femur.

## 2021-03-20 IMAGING — MR MRI SHOULDER LT WO CONTRAST
4 series · 31 of 40 positions shown · non-contrast
Comparison: None available.

INDICATION: Left shoulder pain .
TECHNIQUE: Multiplanar, multisequence MR imaging of the left shoulder without contrast.

[Series 5: t2_axial_fs · axial · left · 3.0mm · 0.47mm/px · z∈[-48,+48]mm · 9 of 25 slices shown]
[im 1/25]
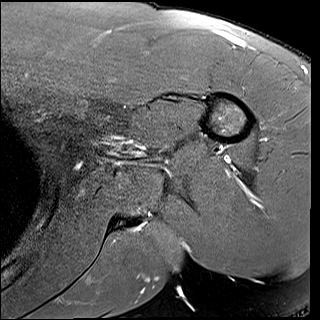
[im 4/25]
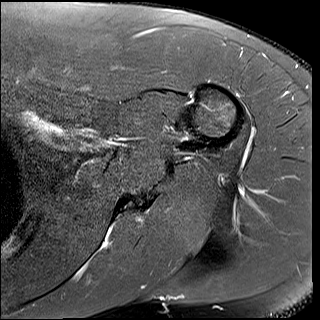
[im 7/25]
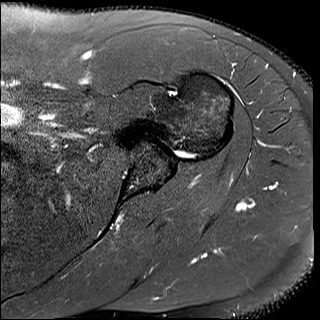
[im 10/25]
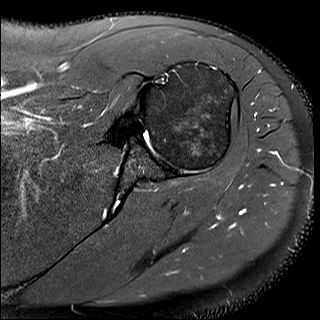
[im 13/25]
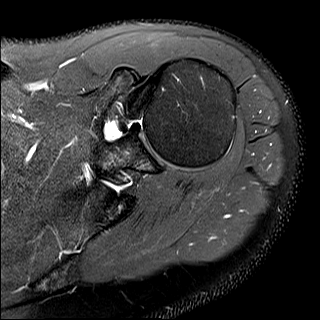
[im 16/25]
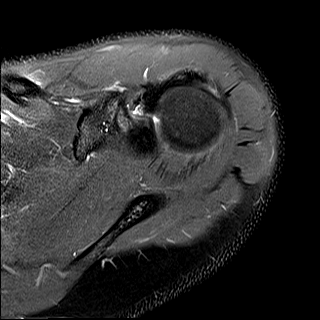
[im 19/25]
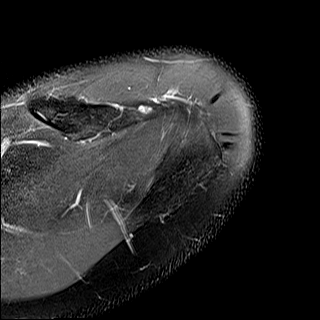
[im 22/25]
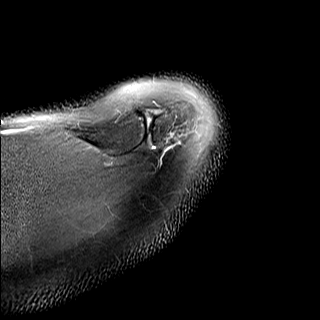
[im 25/25]
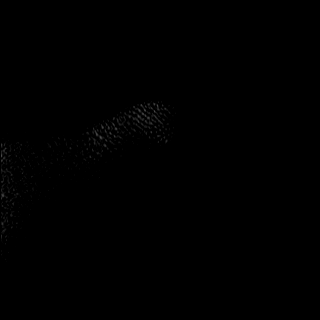

[Series 6: t2_cor_fs · oblique · left · 3.0mm · 0.50mm/px · 9 of 25 slices shown]
[im 1/25]
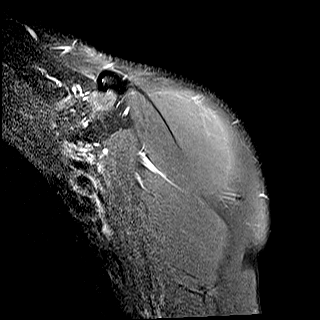
[im 4/25]
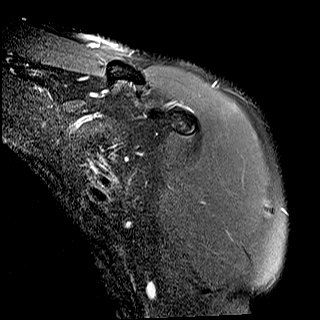
[im 7/25]
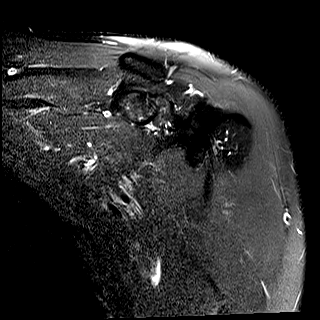
[im 10/25]
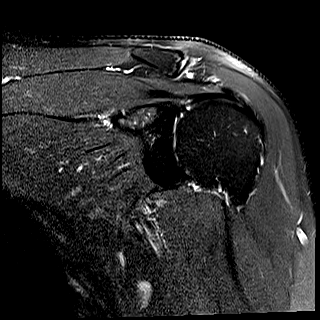
[im 13/25]
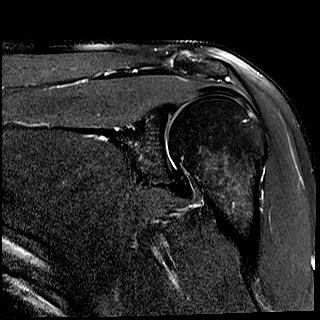
[im 16/25]
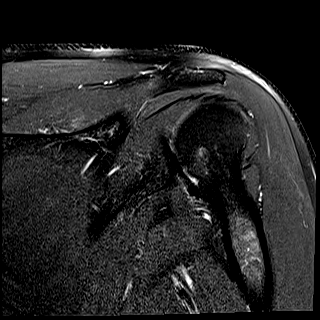
[im 19/25]
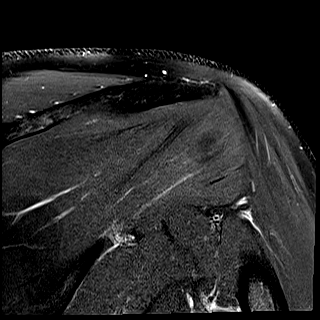
[im 22/25]
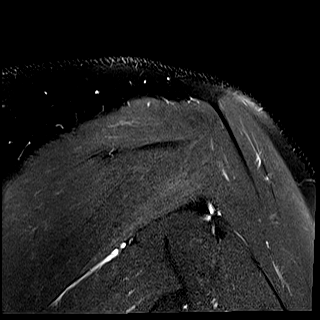
[im 25/25]
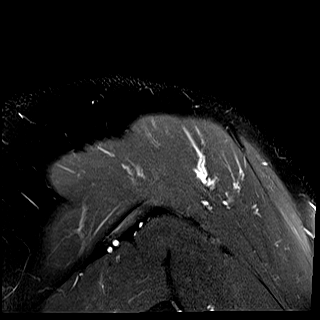

[Series 7: t2_sag_fs · oblique · left · 3.0mm · 0.31mm/px · 10 of 28 slices shown]
[im 1/28]
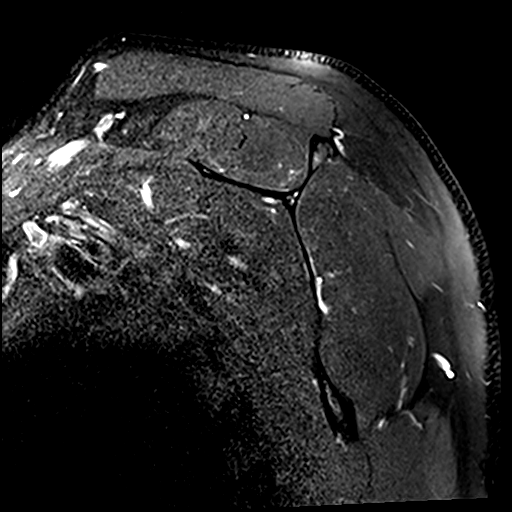
[im 3/28]
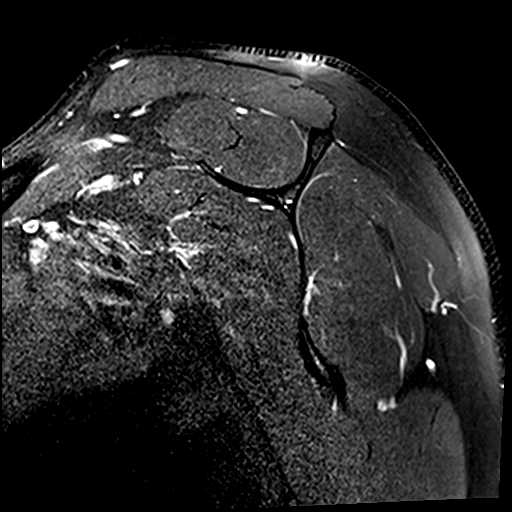
[im 6/28]
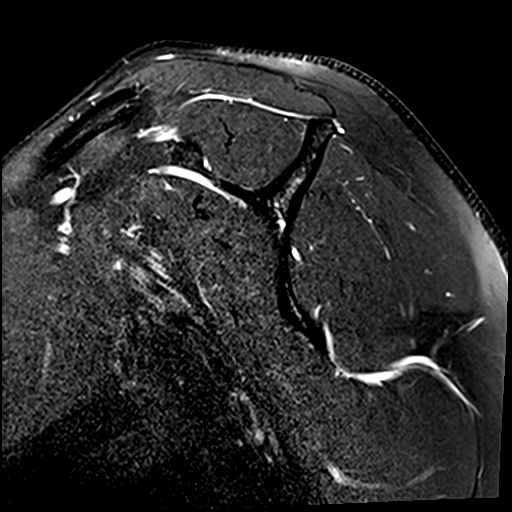
[im 9/28]
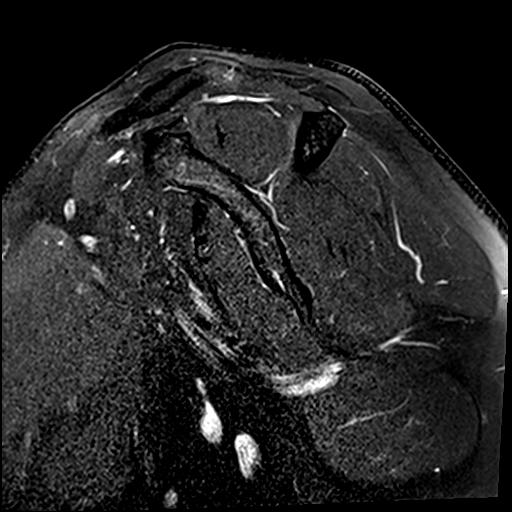
[im 11/28]
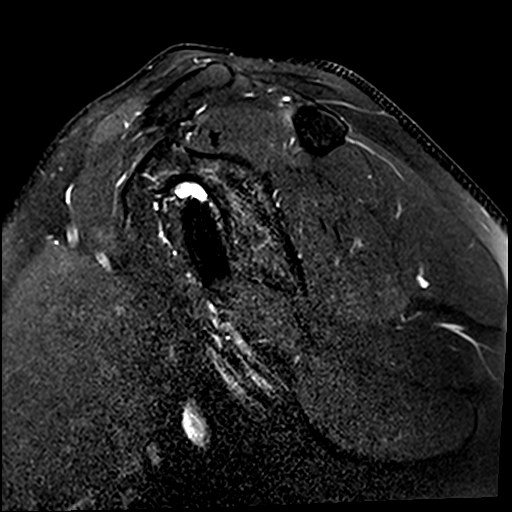
[im 14/28]
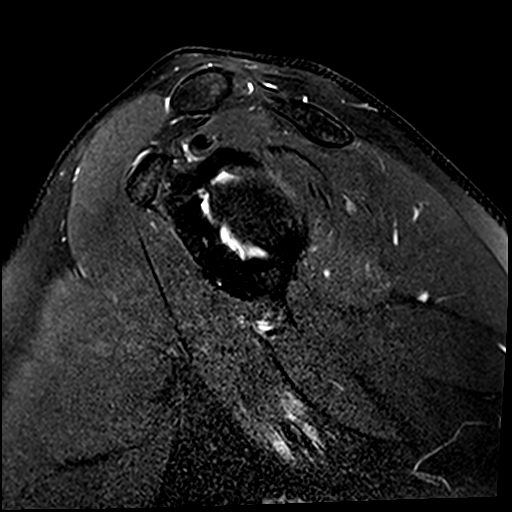
[im 17/28]
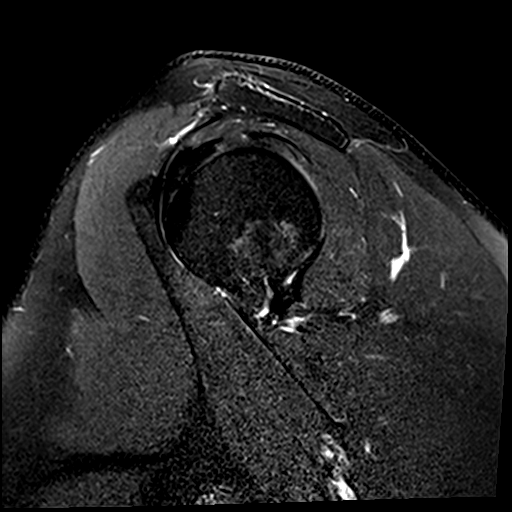
[im 19/28]
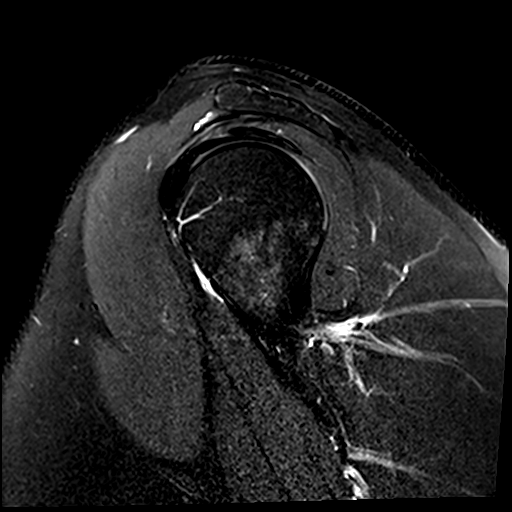
[im 22/28]
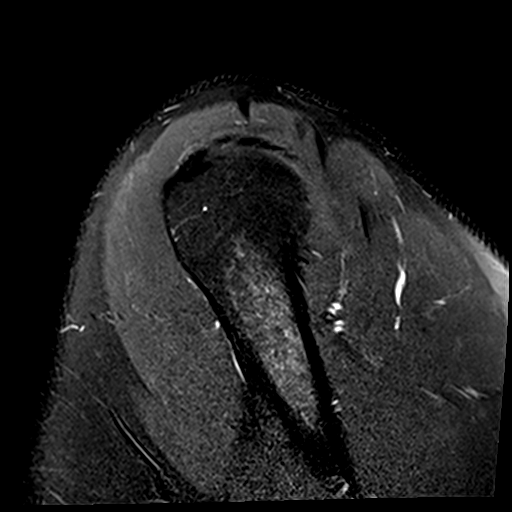
[im 25/28]
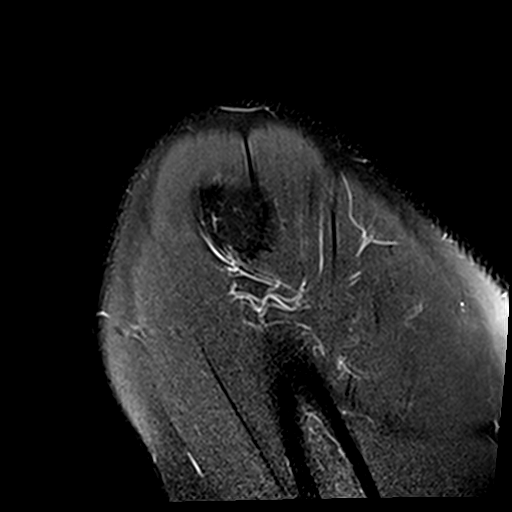

[Series 8: t1_sag · oblique · left · 3.0mm · 0.50mm/px · 3 of 28 slices shown]
[im 3/28]
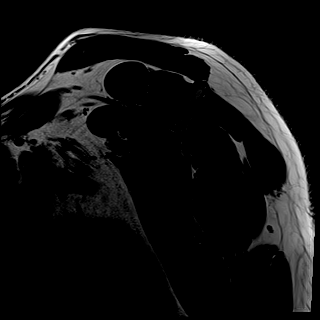
[im 14/28]
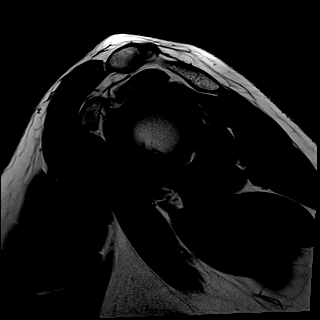
[im 25/28]
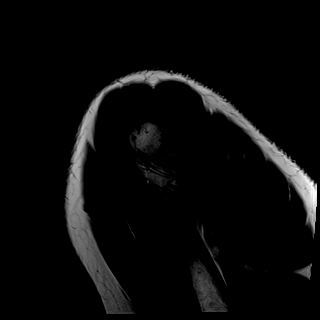

[31 of 40 positions shown; findings below may reference images not displayed]

FINDINGS: OSSEOUS: No acute fracture, avascular necrosis or aggressive osseous lesion. 

ACROMIAL OUTLET: Minimal osteoarthrosis of the acromioclavicular joint. A small  amount of fluid is present within the subacromial/subdeltoid bursa. The acromion is nonhooked. The coracoclavicular and coracoacromial ligaments are intact.

ROTATOR CUFF:

Supraspinatus and infraspinatus: Mild tendinosis at the insertion of the confluent supraspinatus/infraspinatus tendons. The supraspinatus and infraspinatus tendons are otherwise intact.

Teres minor: Intact.

Subscapularis: Intact.

Fatty atrophy: The rotator cuff muscles are maintained.

LABRUM: The labrum is intact without fluid-filled labral cleft or paralabral cyst.

BICEPS TENDON: The proximal intra-articular and extra-articular long head biceps tendon are intact.

GLENOHUMERAL JOINT: The glenohumeral articular cartilage is maintained. No focal chondral defect is identified. The glenohumeral joint is normal in alignment.

OTHER: The inferior glenohumeral ligament is unremarkable. No glenohumeral joint effusion.
IMPRESSION: 1.
Mild tendinosis of the confluent supraspinatus/infraspinatus tendons. No rotator cuff

tear.

2.
Minimal acromioclavicular joint osteoarthritis.

## 2021-03-20 IMAGING — MR MRI SHOULDER RT WO CONTRAST
4 series · 31 of 40 positions shown · non-contrast
Comparison: None available.

INDICATION: Pain in right shoulder
TECHNIQUE: Multiplanar, multisequence MR imaging of the right shoulder without contrast.

[Series 5: t2_axial_fs · axial · right · 3.0mm · 0.47mm/px · z∈[-39,+57]mm · 9 of 25 slices shown]
[im 1/25]
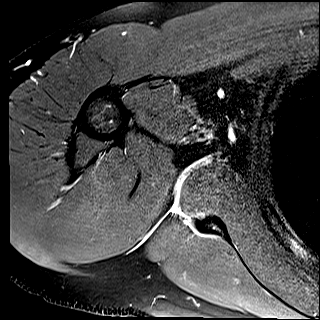
[im 4/25]
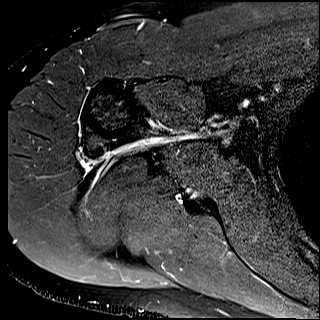
[im 7/25]
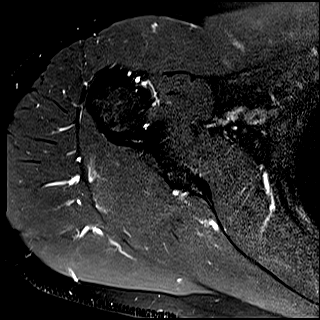
[im 10/25]
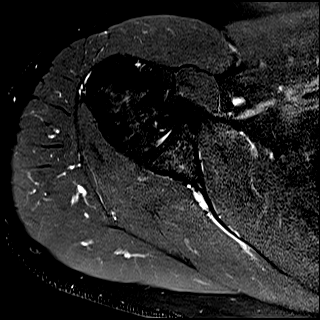
[im 13/25]
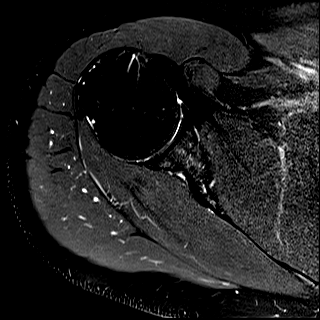
[im 16/25]
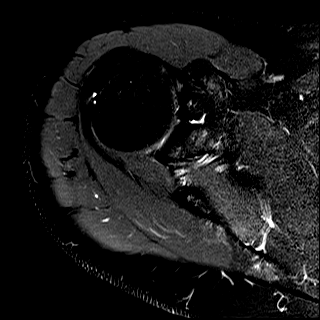
[im 19/25]
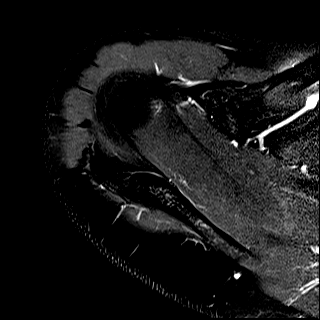
[im 22/25]
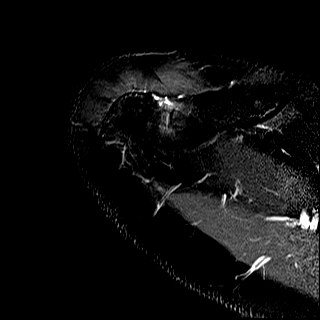
[im 25/25]
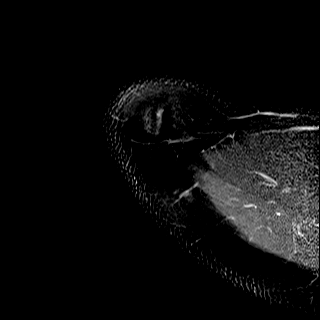

[Series 6: t2_cor_fs · oblique · right · 3.0mm · 0.50mm/px · 9 of 25 slices shown]
[im 1/25]
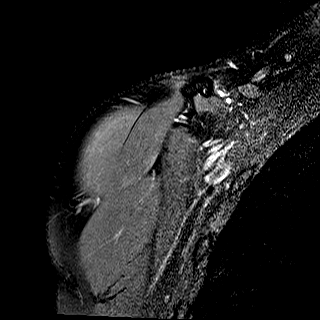
[im 4/25]
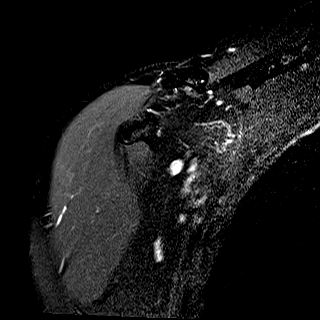
[im 7/25]
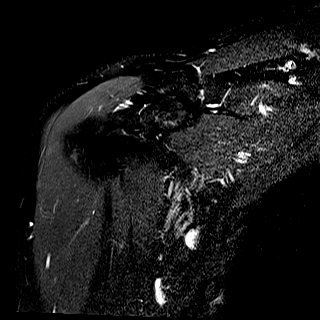
[im 10/25]
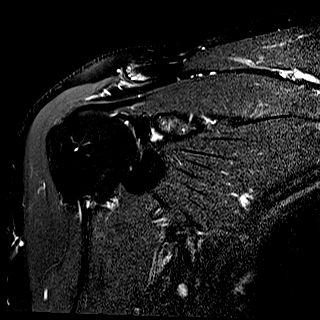
[im 13/25]
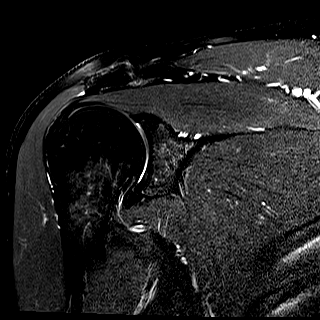
[im 16/25]
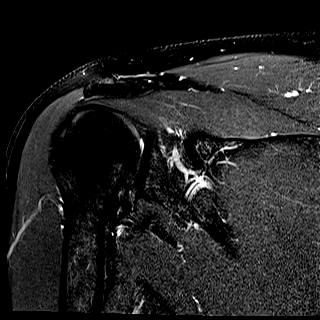
[im 19/25]
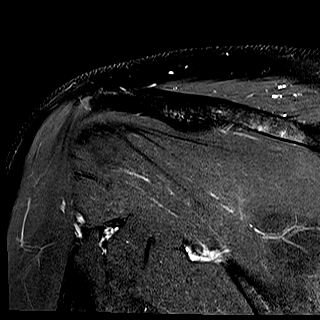
[im 22/25]
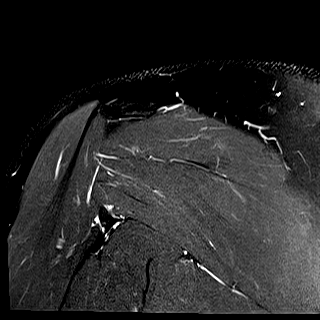
[im 25/25]
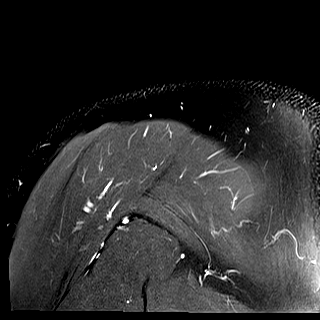

[Series 7: t2_sag_fs · oblique · right · 3.0mm · 0.31mm/px · 10 of 28 slices shown]
[im 1/28]
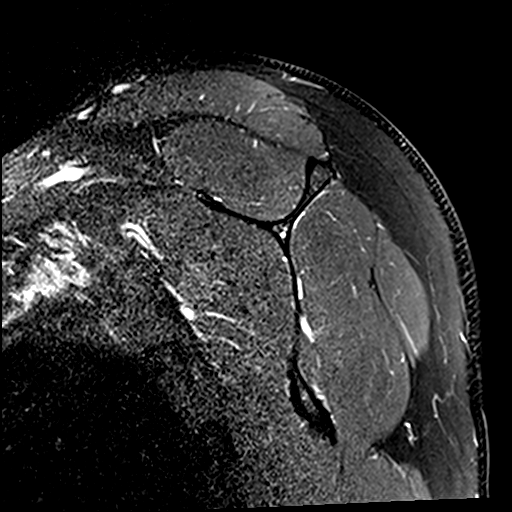
[im 3/28]
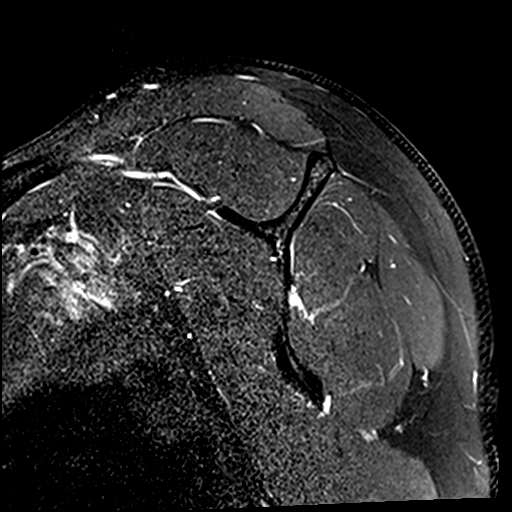
[im 6/28]
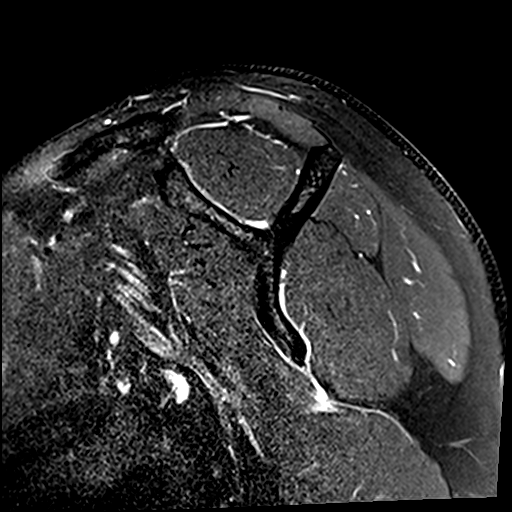
[im 9/28]
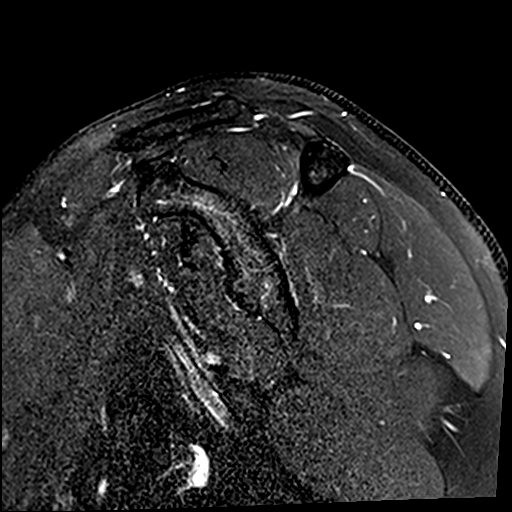
[im 11/28]
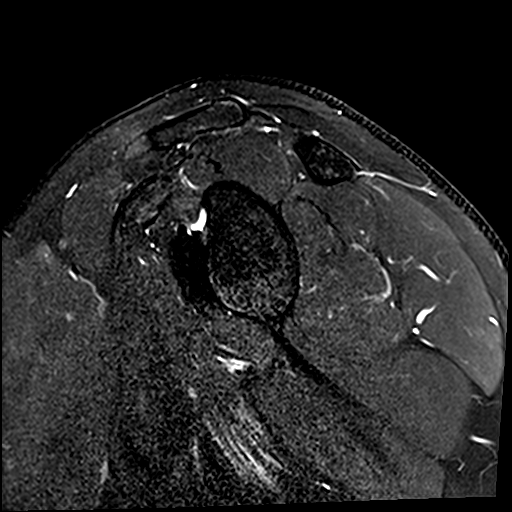
[im 14/28]
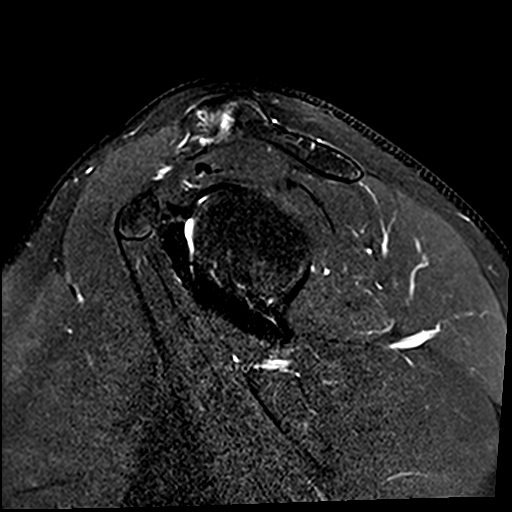
[im 17/28]
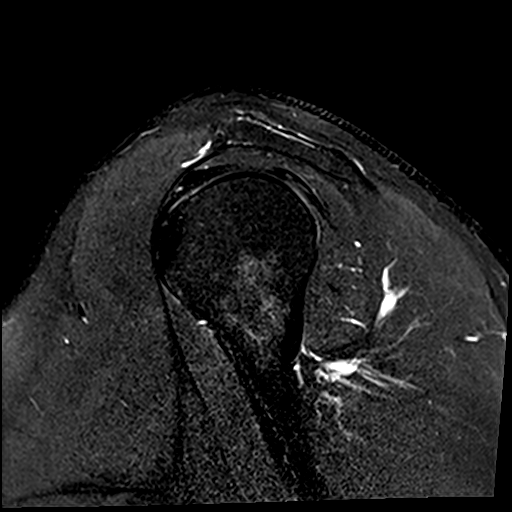
[im 19/28]
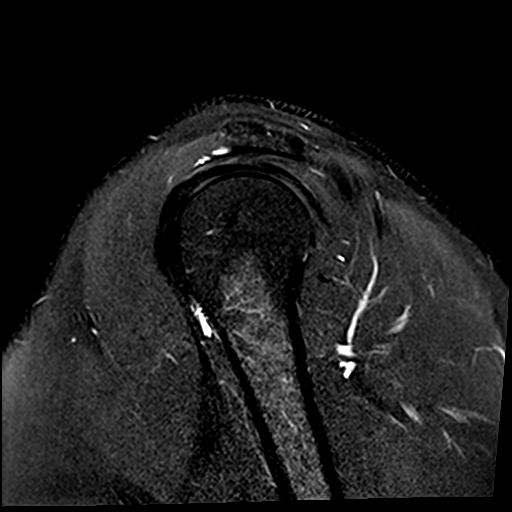
[im 22/28]
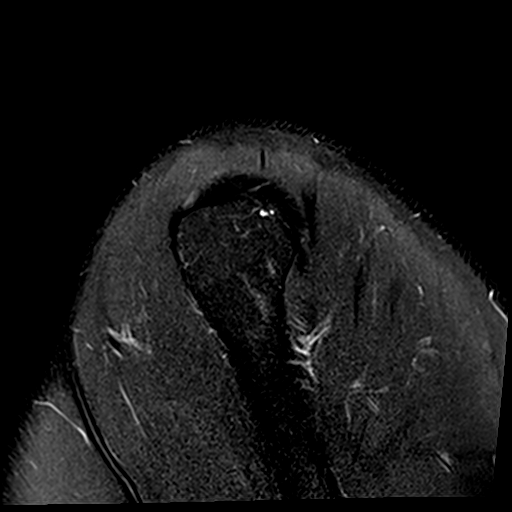
[im 25/28]
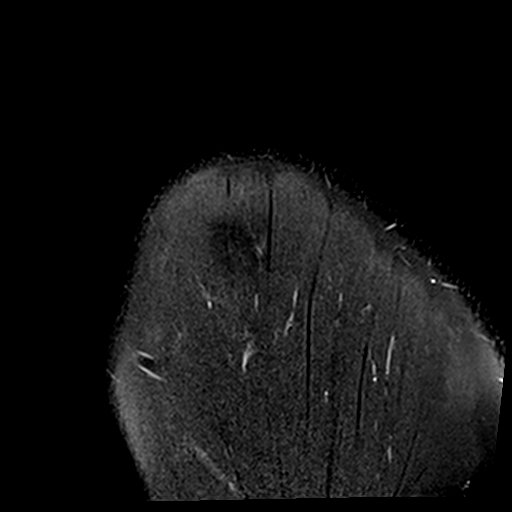

[Series 8: t1_sag · oblique · right · 3.0mm · 0.47mm/px · 3 of 28 slices shown]
[im 3/28]
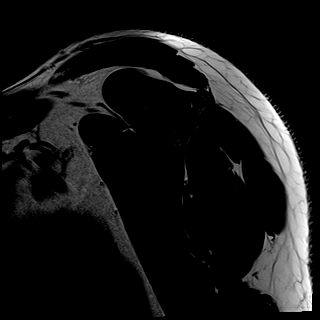
[im 14/28]
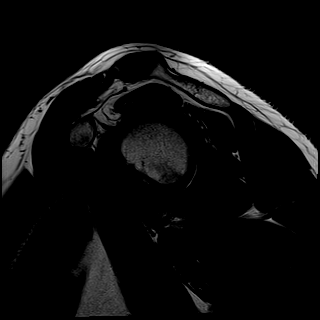
[im 25/28]
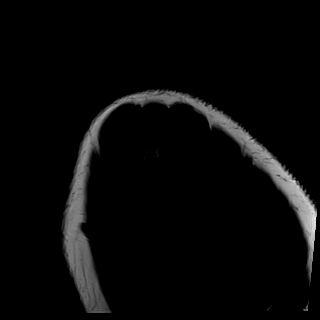

[31 of 40 positions shown; findings below may reference images not displayed]

FINDINGS: OSSEOUS: No acute fracture, avascular necrosis or aggressive osseous lesion. 

ACROMIAL OUTLET: Mild acromioclavicular joint osteoarthritis, with moderate juxta-articular bone marrow edema within the distal clavicle. A small amount of fluid is present within the subacromial/subdeltoid bursa. The acromion is nonhooked. The coracoclavicular and coracoacromial ligaments are intact.

ROTATOR CUFF:

Supraspinatus and infraspinatus: Mild supraspinatus and anterior infraspinatus tendinosis.

Teres minor: Intact.

Subscapularis: Intact.

Fatty atrophy: The rotator cuff muscles are maintained.

LABRUM: The labrum is intact without fluid-filled labral cleft or paralabral cyst.

BICEPS TENDON: The proximal intra-articular and extra-articular long head biceps tendon are intact.

GLENOHUMERAL JOINT: The glenohumeral articular cartilage is maintained. No focal chondral defect is identified. The glenohumeral joint is normal in alignment.

OTHER: The inferior glenohumeral ligament is unremarkable. No glenohumeral joint effusion.
IMPRESSION: 1.
Mild supraspinatus and infraspinatus tendinosis, without rotator cuff tear.

2.
Mild acromioclavicular joint osteoarthritis, with moderate juxta-articular bone marrow edema within the distal clavicle.

## 2021-12-26 IMAGING — MR MRI HIP RT WO CONTRAST
5 series · 40 of 40 positions shown · non-contrast
Comparison: None

HISTORY: Articular cartilage disorders, right hip, chronic pain
TECHNIQUE: Multiplanar and multisequence MR imaging of the right hip was performed without the administration of intravenous contrast.

[Series 2: t1_cor_obl · coronal · right · 3.0mm · 0.62mm/px · 7 of 22 slices shown]
[im 1/22]
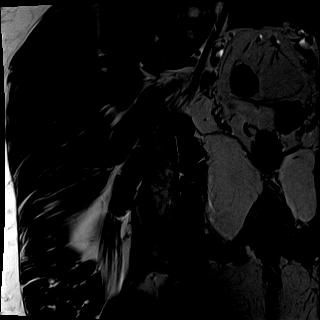
[im 4/22]
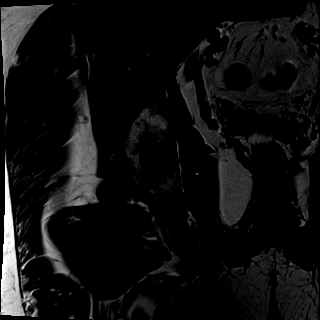
[im 8/22]
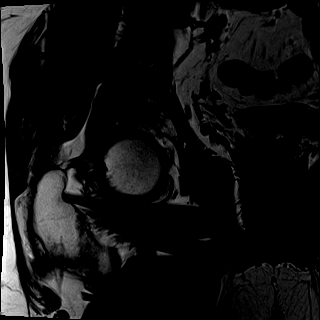
[im 11/22]
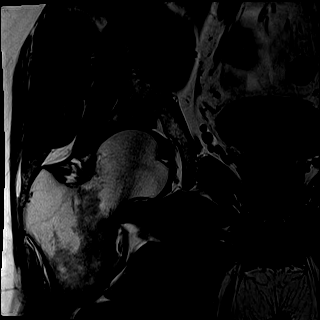
[im 15/22]
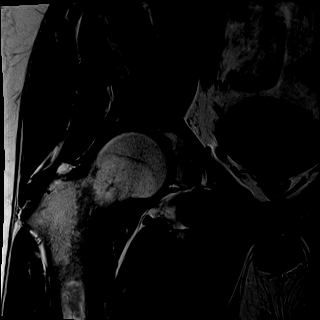
[im 18/22]
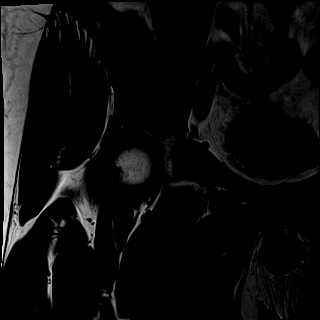
[im 22/22]
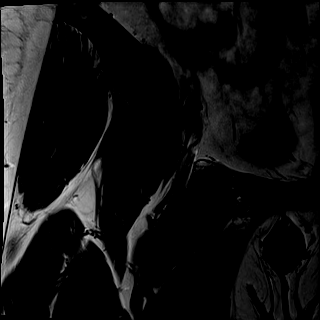

[Series 3: pd_cor_obl_fs · coronal · right · 3.0mm · 0.62mm/px · 6 of 22 slices shown]
[im 1/22]
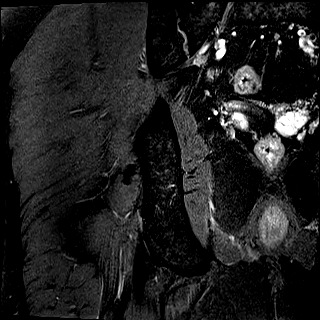
[im 5/22]
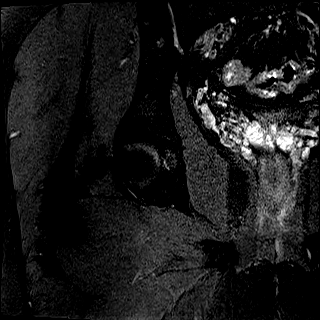
[im 9/22]
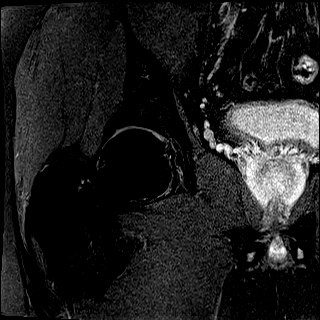
[im 13/22]
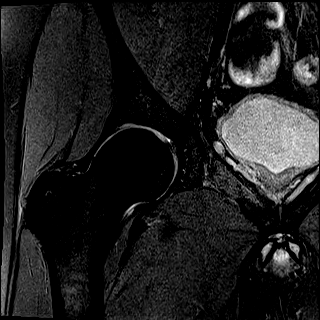
[im 17/22]
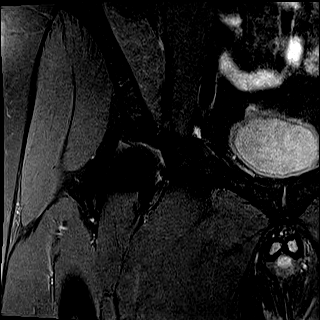
[im 22/22]
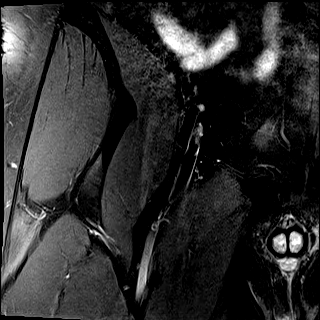

[Series 4: t2_axial_fs · axial · right · 4.0mm · 0.62mm/px · z∈[-107,+108]mm · 13 of 44 slices shown]
[im 1/44]
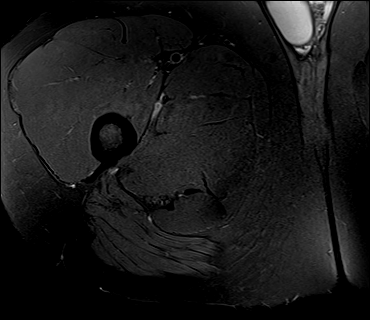
[im 4/44]
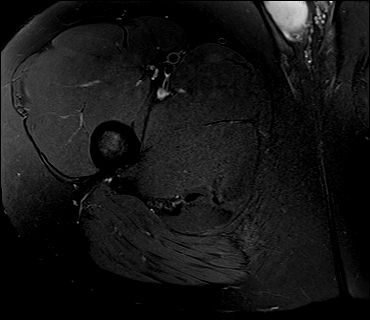
[im 8/44]
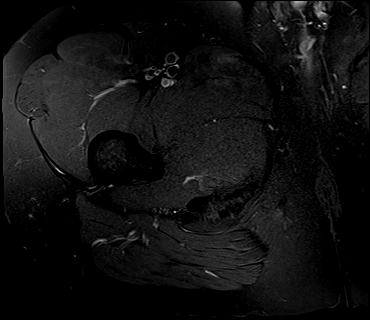
[im 11/44]
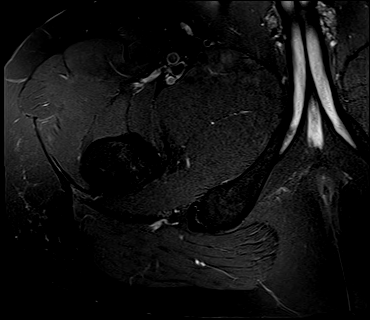
[im 15/44]
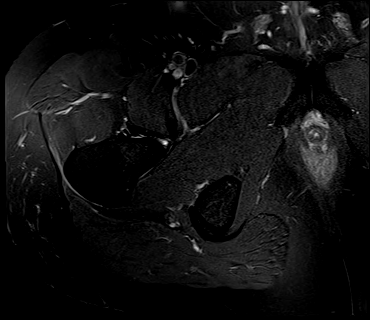
[im 18/44]
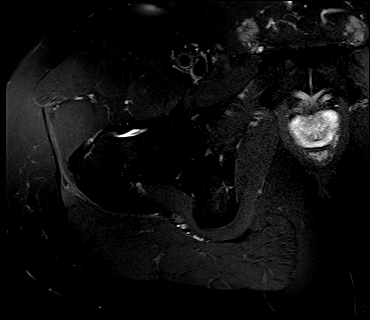
[im 22/44]
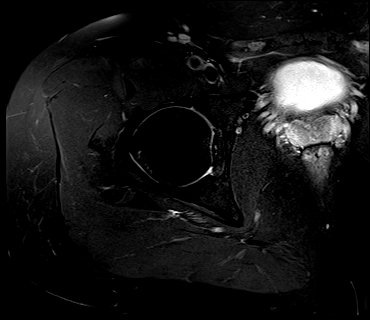
[im 26/44]
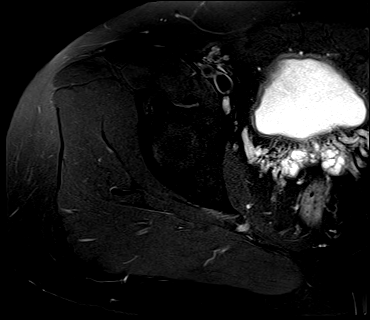
[im 29/44]
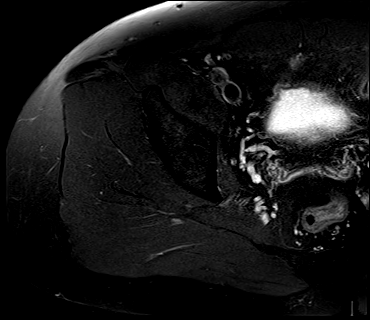
[im 33/44]
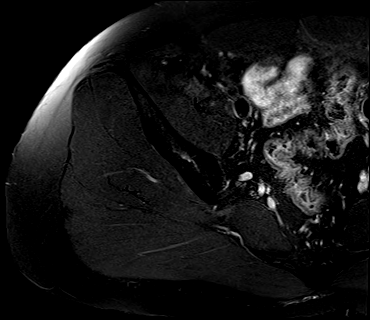
[im 36/44]
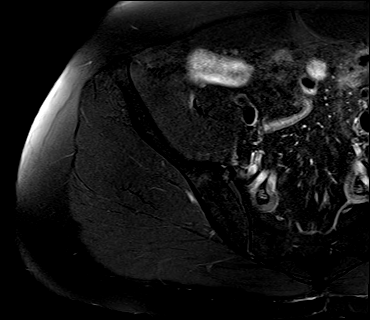
[im 40/44]
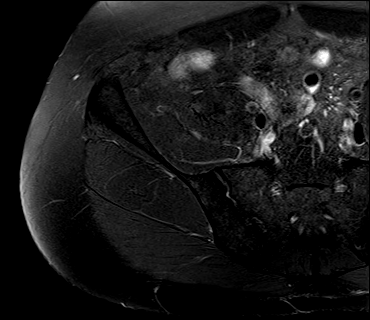
[im 44/44]
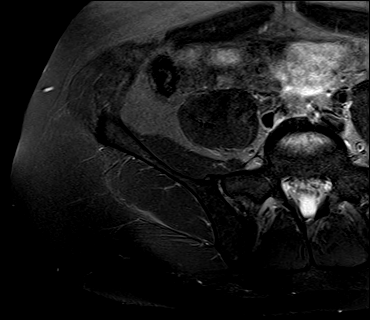

[Series 5: pd_sag_obl_fs · sagittal · right · 3.0mm · 0.56mm/px · 8 of 28 slices shown]
[im 1/28]
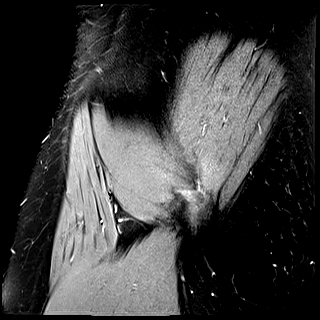
[im 4/28]
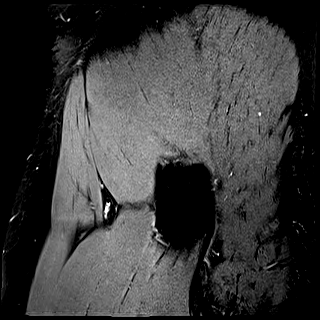
[im 8/28]
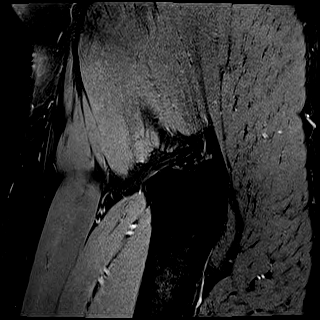
[im 12/28]
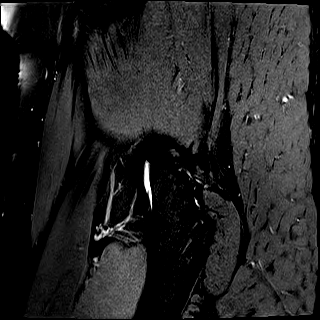
[im 16/28]
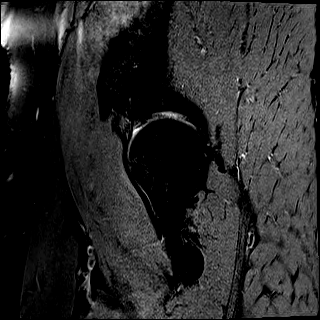
[im 20/28]
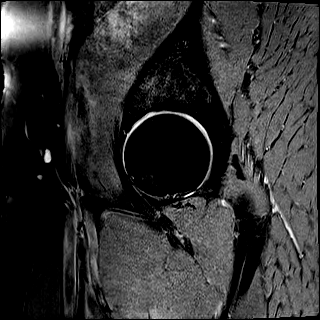
[im 24/28]
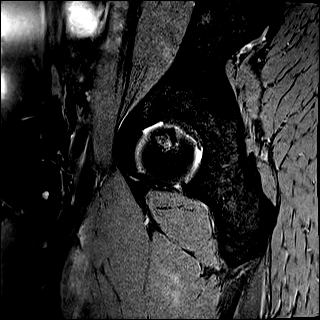
[im 28/28]
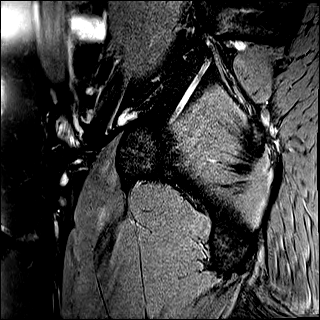

[Series 6: pd_axial_obl_fs · oblique · right · 3.0mm · 0.56mm/px · 6 of 22 slices shown]
[im 1/22]
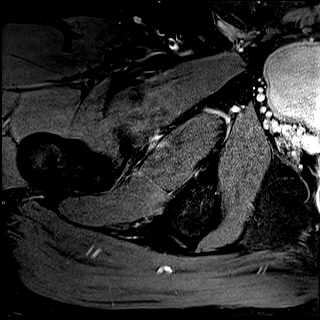
[im 5/22]
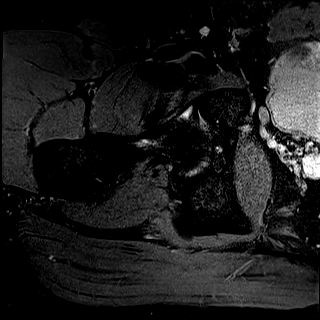
[im 9/22]
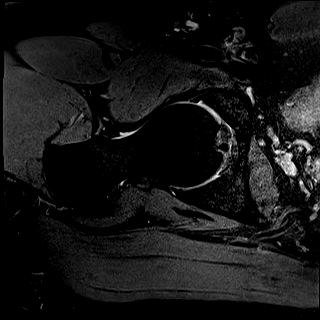
[im 13/22]
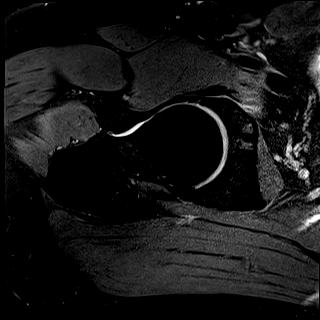
[im 17/22]
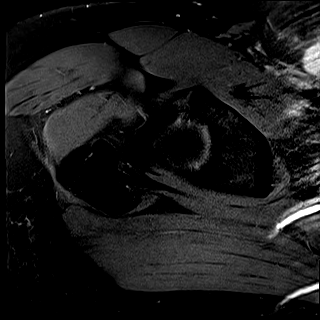
[im 22/22]
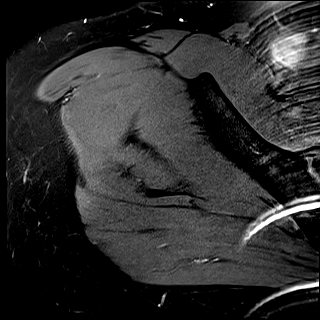

[40 of 40 positions shown; findings below may reference images not displayed]

FINDINGS: Anterosuperior labral tear.

Gluteus minimus and medius tendons are intact and unremarkable. No trochanteric bursitis.

Rectus femoris and iliopsoas tendons are intact.

Common hamstring tendon unremarkable.

Articular surfaces are normally maintained. There is no hip joint effusion. 

Cam morphology. No associated osseous edema. No evidence of ischiofemoral impingement. 

Osseous structures demonstrate no fractures or destructive lesions.

Visualized right SI joint unremarkable. Pubic symphysis unremarkable.

No muscle atrophy. No denervation edema. No cystic or solid masses. No visible adenopathy.

No abnormalities are identified within the visualized visceral pelvis.
IMPRESSION: 1.
Anterosuperior labral tear.

2.
Cam morphology. No associated osseous edema.

3.
Articular surfaces are normally maintained. No effusion.

## 2022-03-19 IMAGING — MR MRI TSPINE WO CONTRAST
6 series · 48 of 48 positions shown · non-contrast
Comparison: None

INDICATION: Thoracic back pain.
TECHNIQUE: MRI of the thoracic spine without contrast. Sagittal multisequence and axial T2-weighted images of the thoracic spine were performed without intravenous contrast. Additional coronal T1-weighted images of the thoracic spine were performed.

[Series 5: ii_aaspine_tspine · coronal · 1.7mm · 1.67mm/px · 28 of 160 slices shown]
[im 1/160]
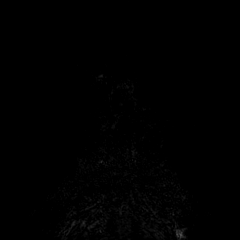
[im 6/160]
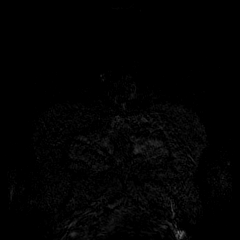
[im 12/160]
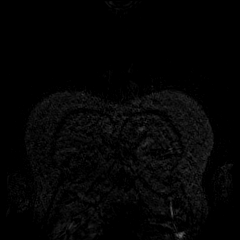
[im 18/160]
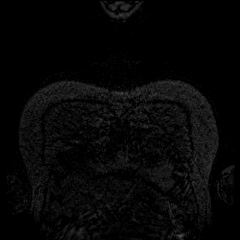
[im 24/160]
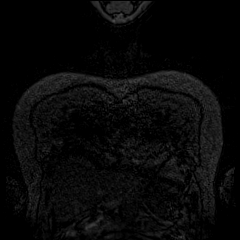
[im 30/160]
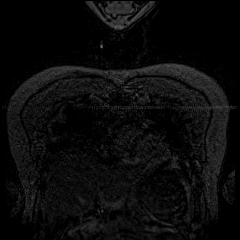
[im 36/160]
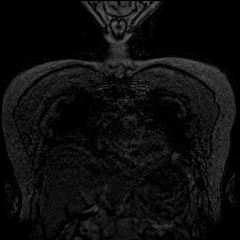
[im 42/160]
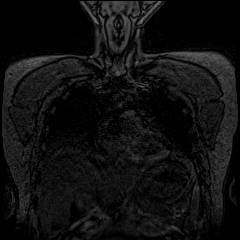
[im 48/160]
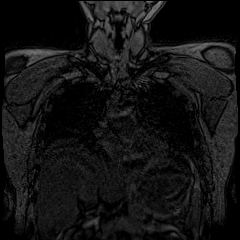
[im 54/160]
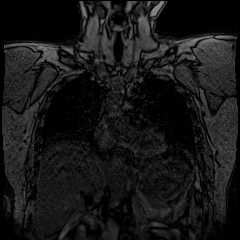
[im 59/160]
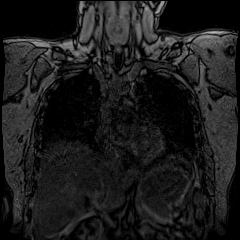
[im 65/160]
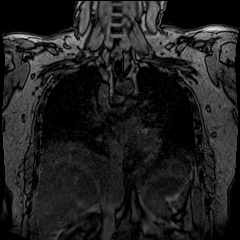
[im 71/160]
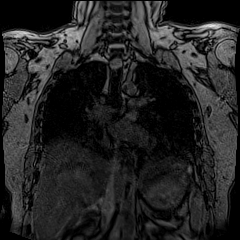
[im 77/160]
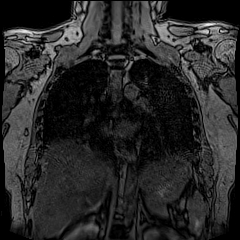
[im 83/160]
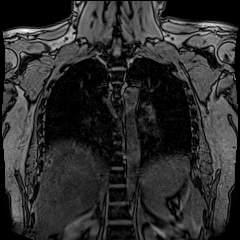
[im 89/160]
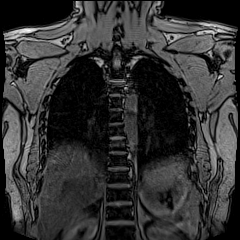
[im 95/160]
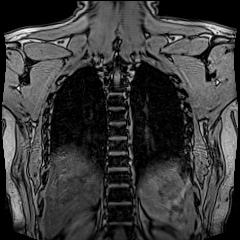
[im 101/160]
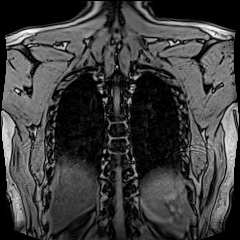
[im 107/160]
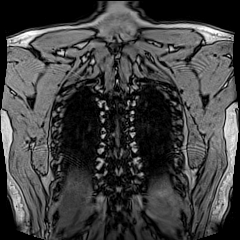
[im 112/160]
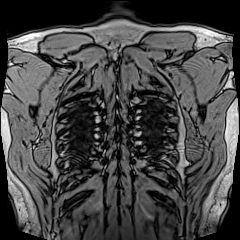
[im 118/160]
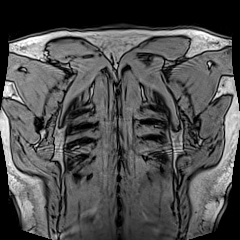
[im 124/160]
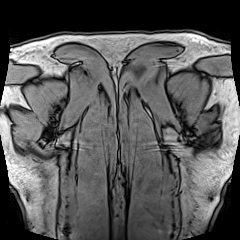
[im 130/160]
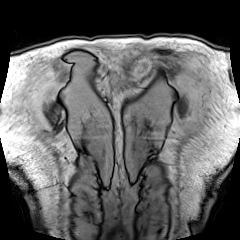
[im 136/160]
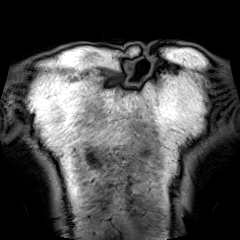
[im 142/160]
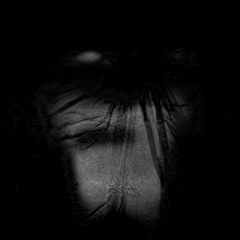
[im 148/160]
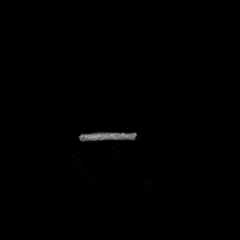
[im 154/160]
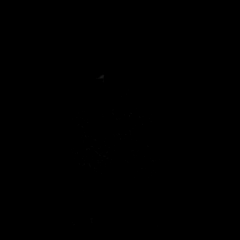
[im 160/160]
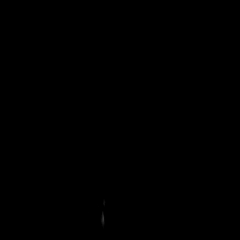

[Series 16: t2_tse_sag · sagittal · 3.0mm · 0.89mm/px · 3 of 18 slices shown]
[im 1/18]
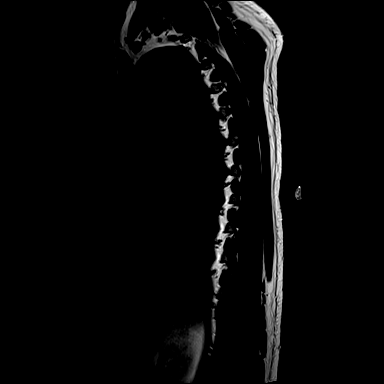
[im 9/18]
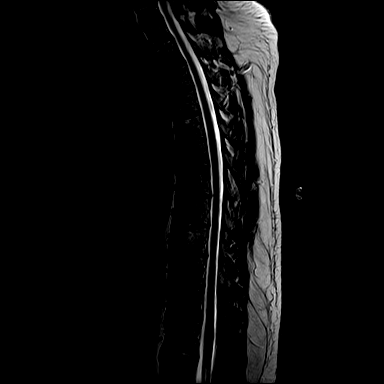
[im 18/18]
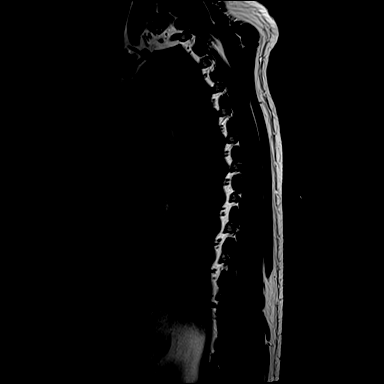

[Series 17: t1_sag · sagittal · 3.0mm · 0.89mm/px · 3 of 18 slices shown]
[im 1/18]
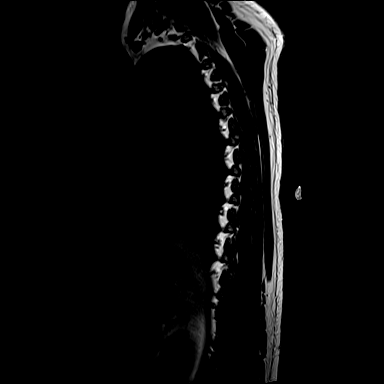
[im 9/18]
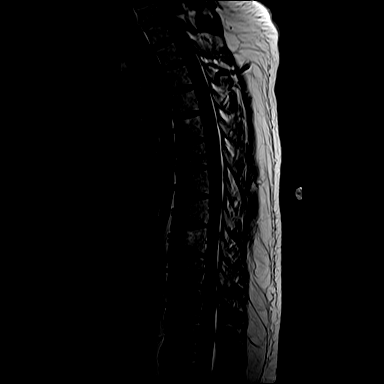
[im 18/18]
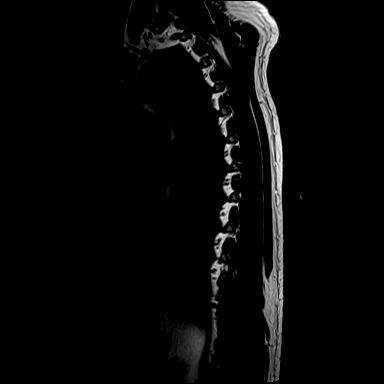

[Series 18: ir_sag · sagittal · 3.0mm · 1.12mm/px · 3 of 18 slices shown]
[im 1/18]
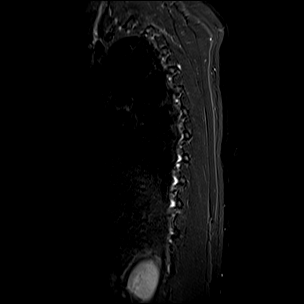
[im 9/18]
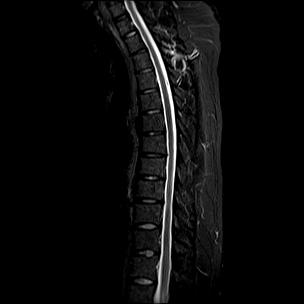
[im 18/18]
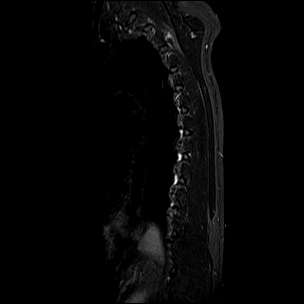

[Series 19: t2_axial · axial · 3.5mm · 0.62mm/px · z∈[-158,-17]mm · 5 of 32 slices shown (1 of 2)]
[im 1/32]
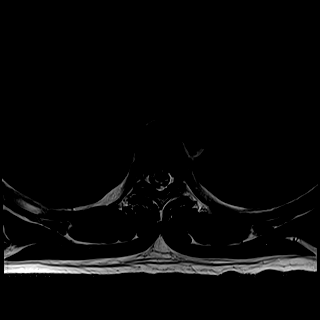
[im 8/32]
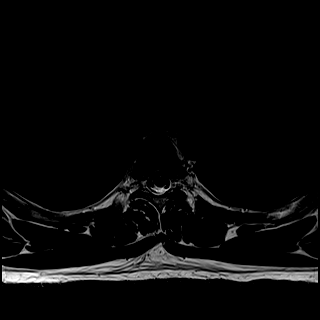
[im 16/32]
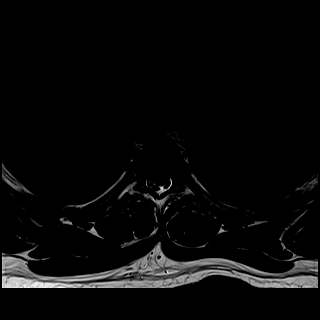
[im 24/32]
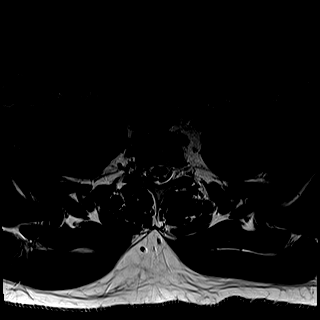
[im 32/32]
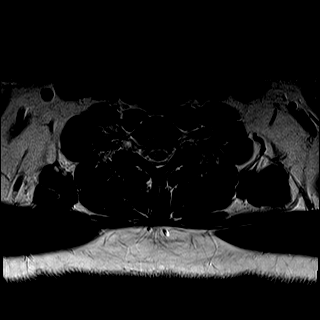

[Series 20: t2_axial · axial · 3.5mm · 0.62mm/px · z∈[-300,-128]mm · 6 of 38 slices shown (2 of 2)]
[im 1/38]
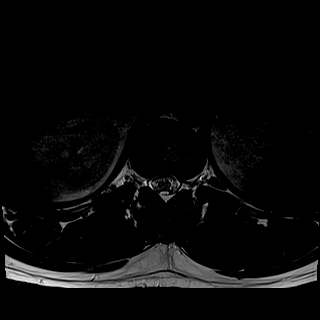
[im 8/38]
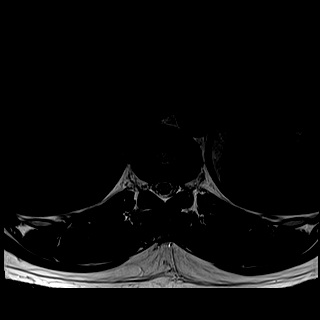
[im 15/38]
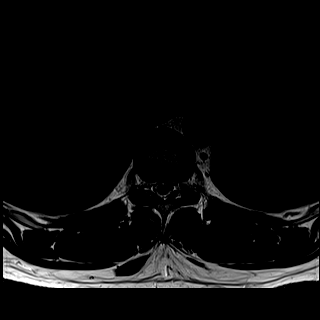
[im 23/38]
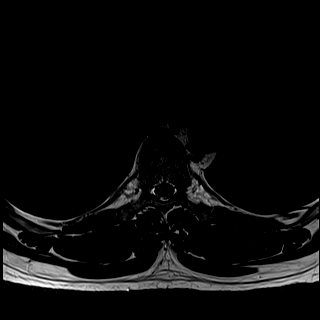
[im 30/38]
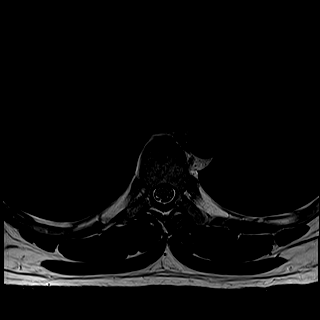
[im 38/38]
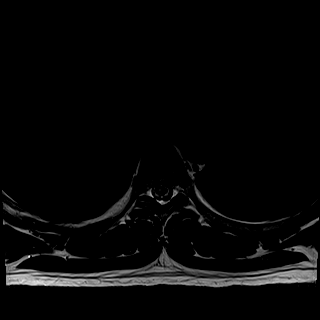

[48 of 48 positions shown; findings below may reference images not displayed]

FINDINGS: From the lateral scout image annotation patient has 7 cervical vertebrae, 12 thoracic vertebrae and 5 lumbar-type vertebrae.

 There is normal alignment of the vertebral elements. The signal intensity of the bone marrow and thoracic neural cord is normal. Spinal canal is of normal caliber. There is no disc herniation, spinal canal or foraminal stenosis. No paraspinal or epidural soft tissue mass or fluid collection is seen.
IMPRESSION: Negative MRI of the thoracic spine without contrast.

## 2022-06-11 IMAGING — CR TIBIA-FIBULA LT 2 VWS
1 series · 3 of 3 positions shown · non-contrast
Comparison: None
TECHNIQUE/FINDINGS:  Two views of the left tib-fib show no evidence of fracture, dislocation or radiopaque foreign body. Soft tissues are unremarkable.

Images Obtained from [facility]
REASON FOR EXAM: Stress fracture, left tibia-fibula, sequela

[Series 1: lat · 0.17mm/px · 3 of 3 slices shown]
[im 1/3]
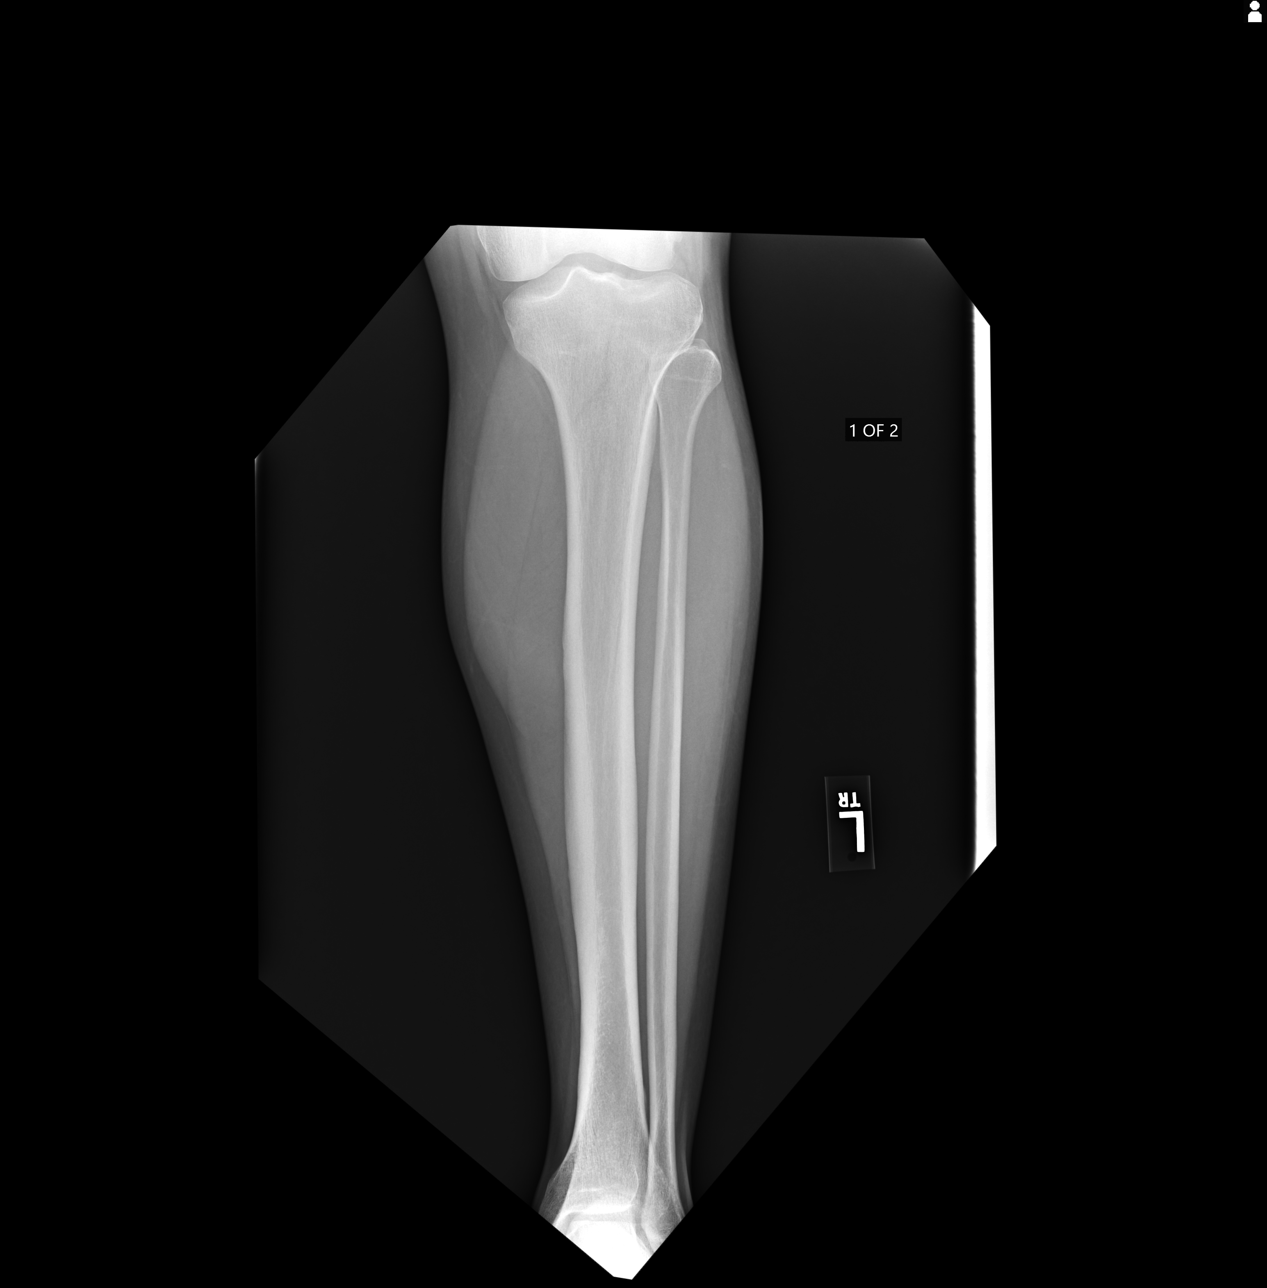
[im 2/3]
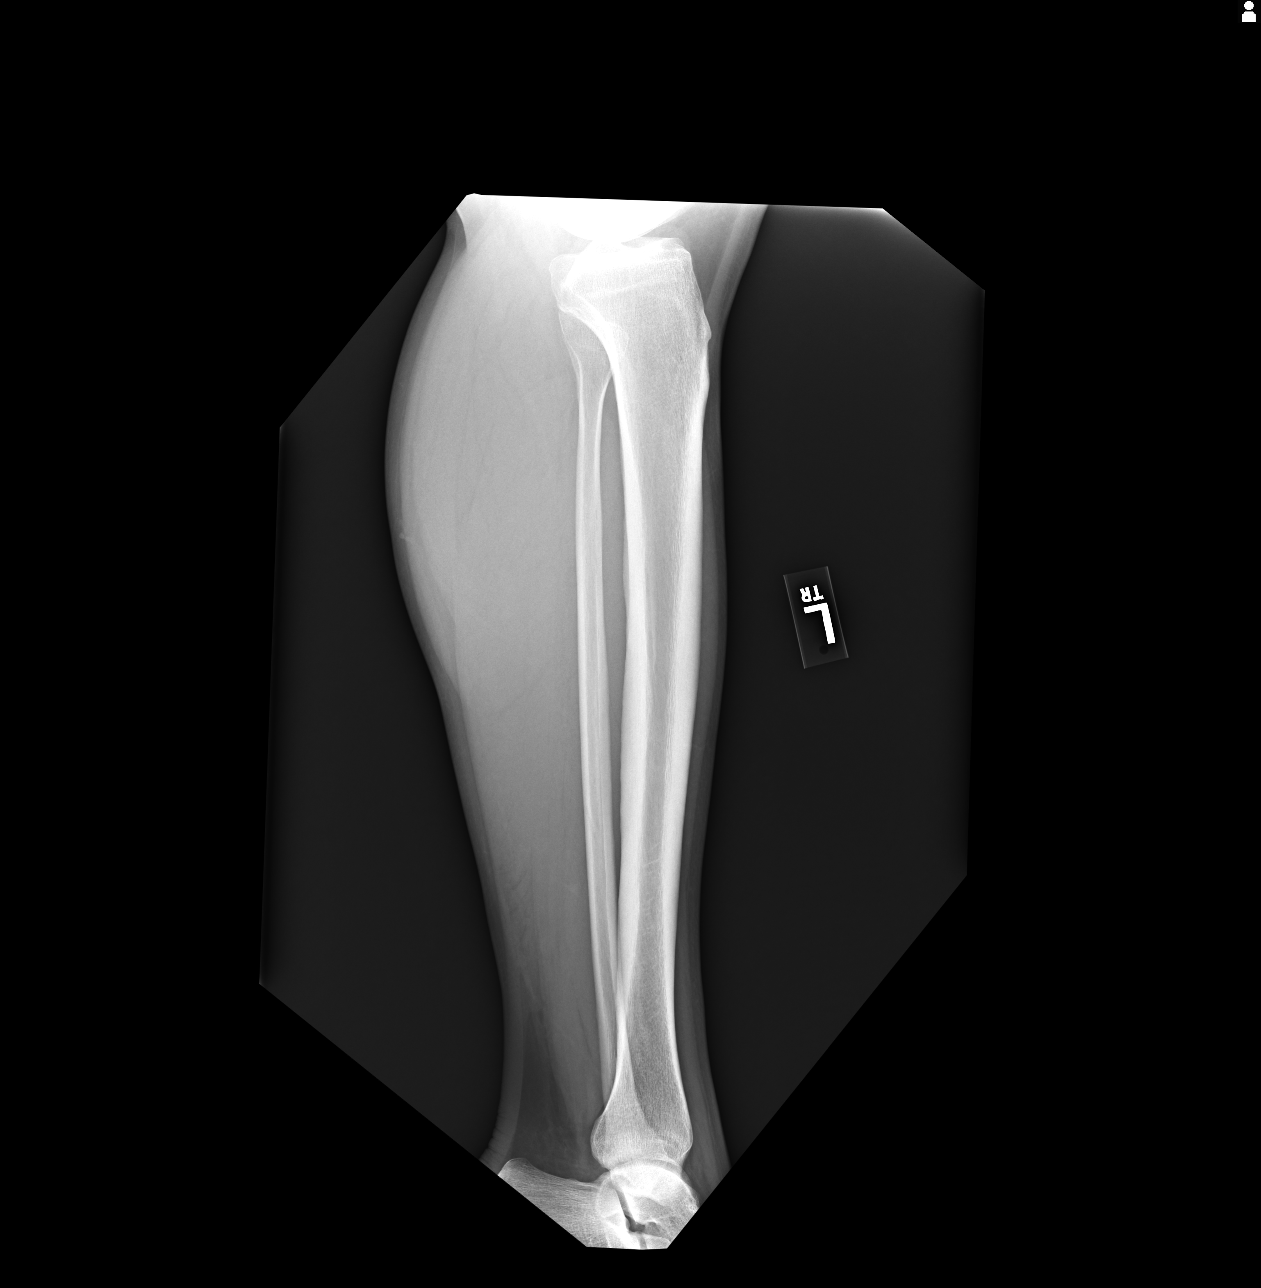
[im 3/3]
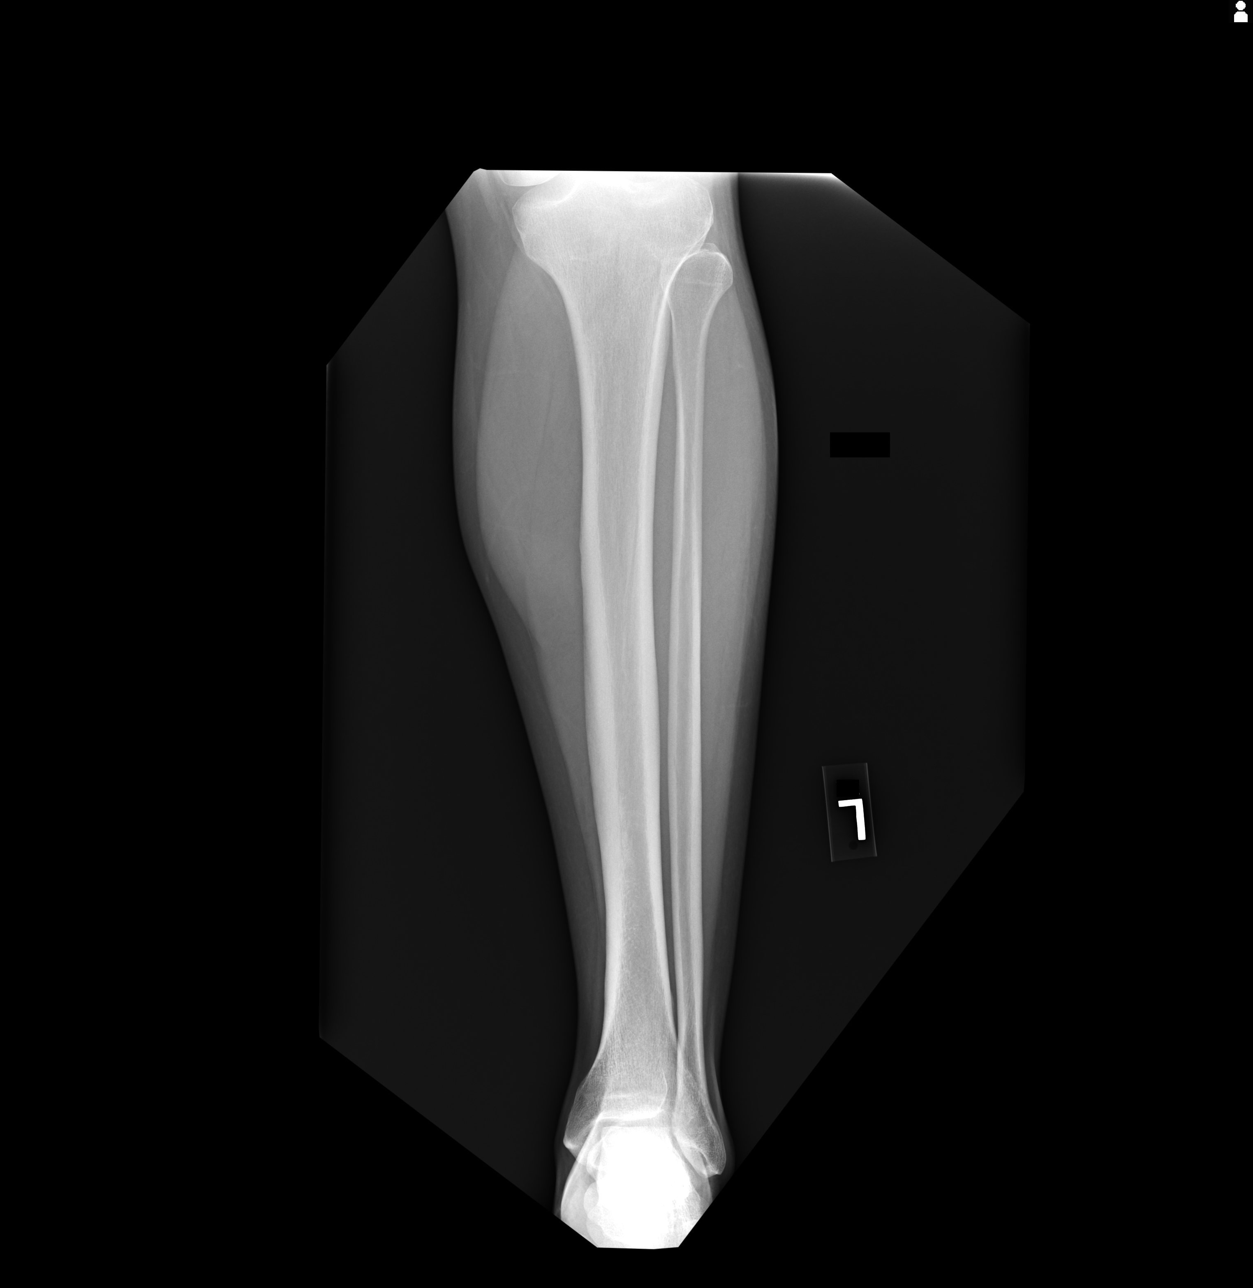

[3 of 3 positions shown; findings below may reference images not displayed]

IMPRESSION: Negative exam.

## 2022-06-11 IMAGING — CR SHOULDER LT 2 VWS MIN
1 series · 3 of 3 positions shown · non-contrast
Comparison: None.

Images Obtained from [facility]
REASON FOR EXAM: Arthritis.

[Series 1: internal rotate · 0.17mm/px · 3 of 3 slices shown]
[im 1/3]
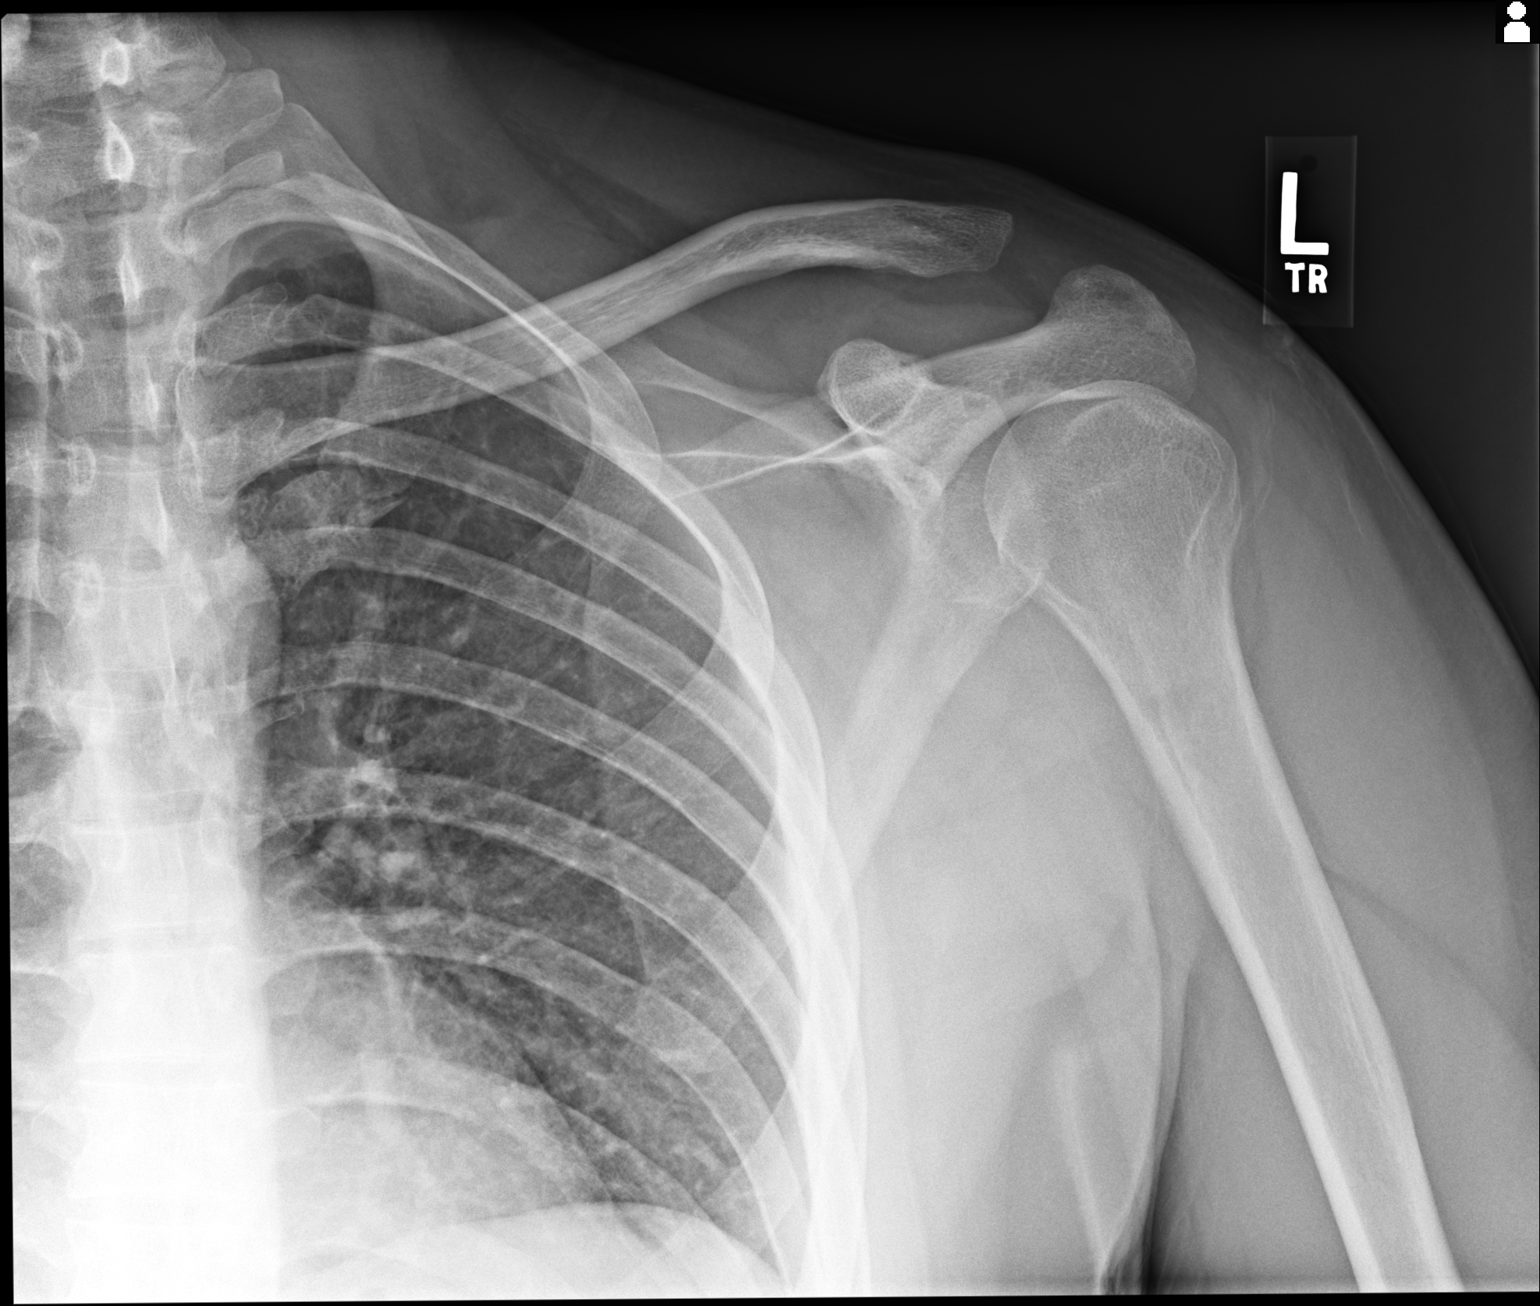
[im 2/3]
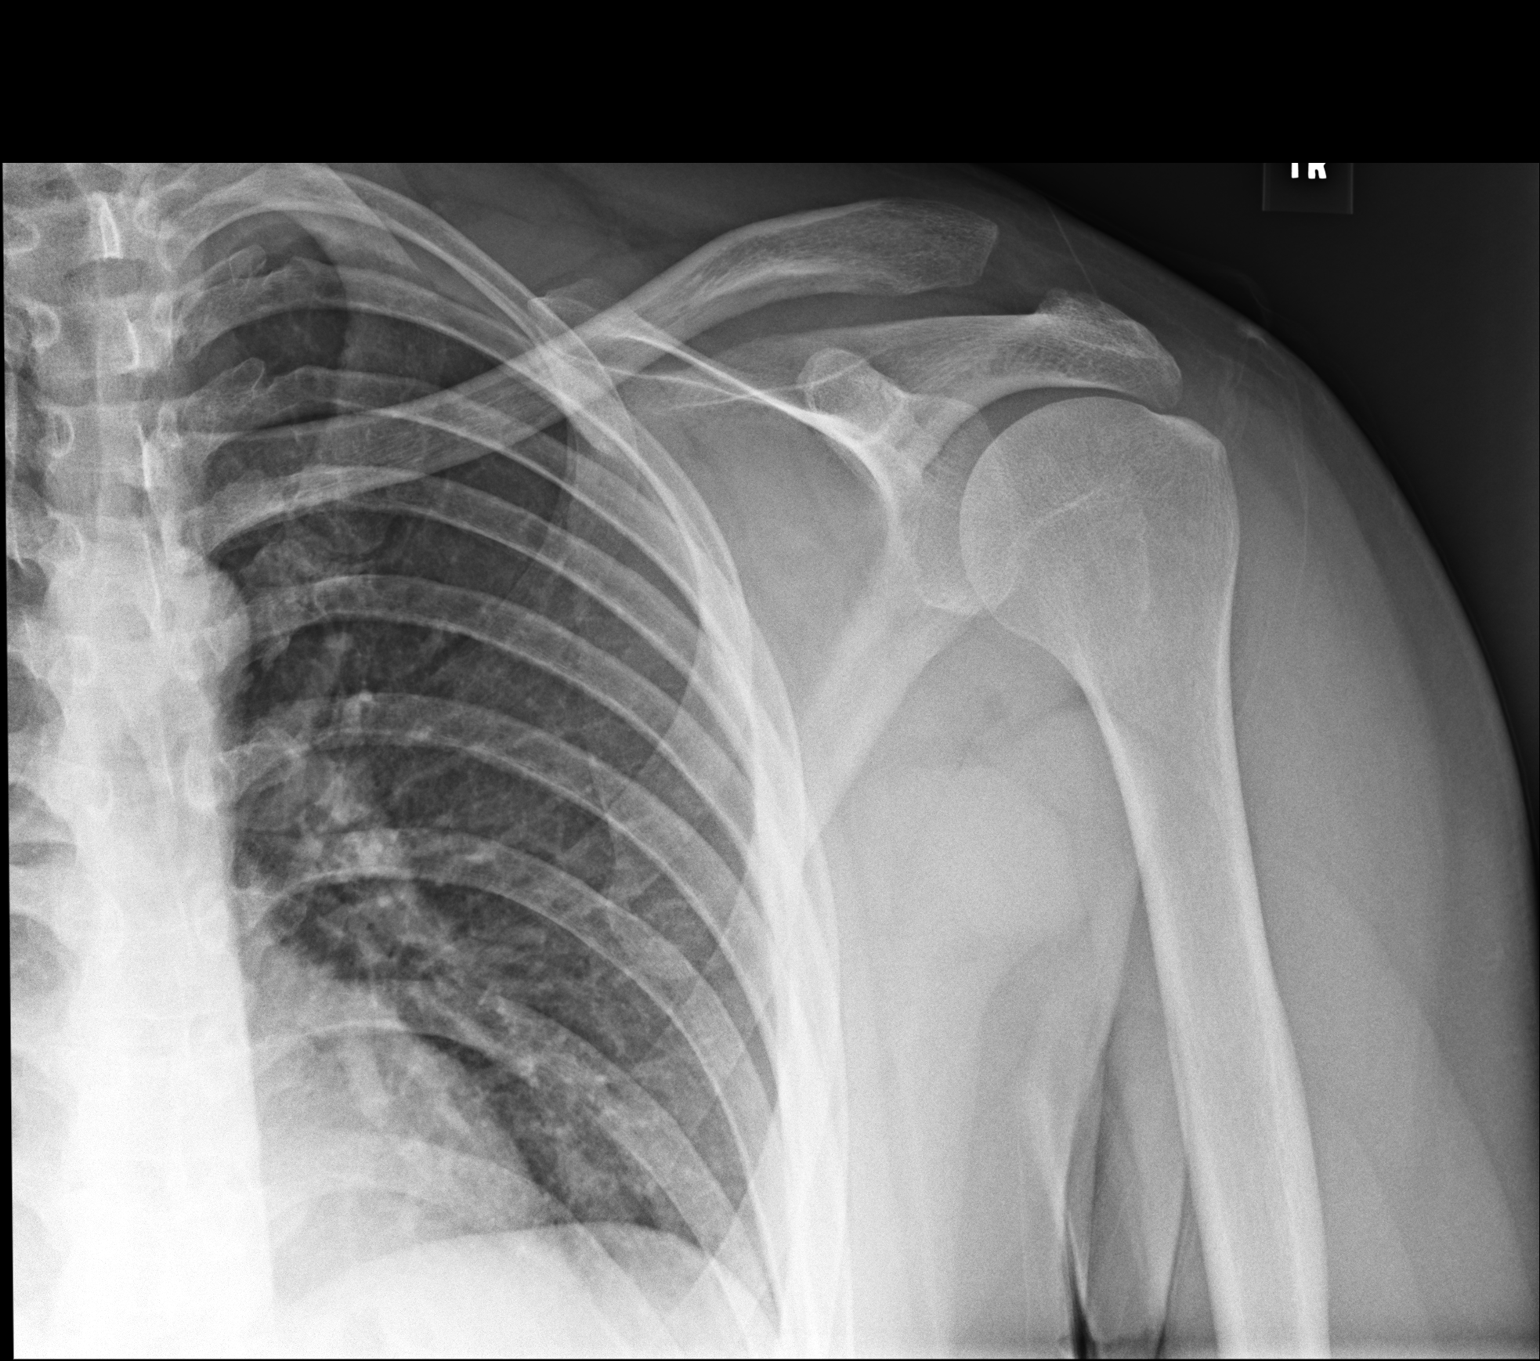
[im 3/3]
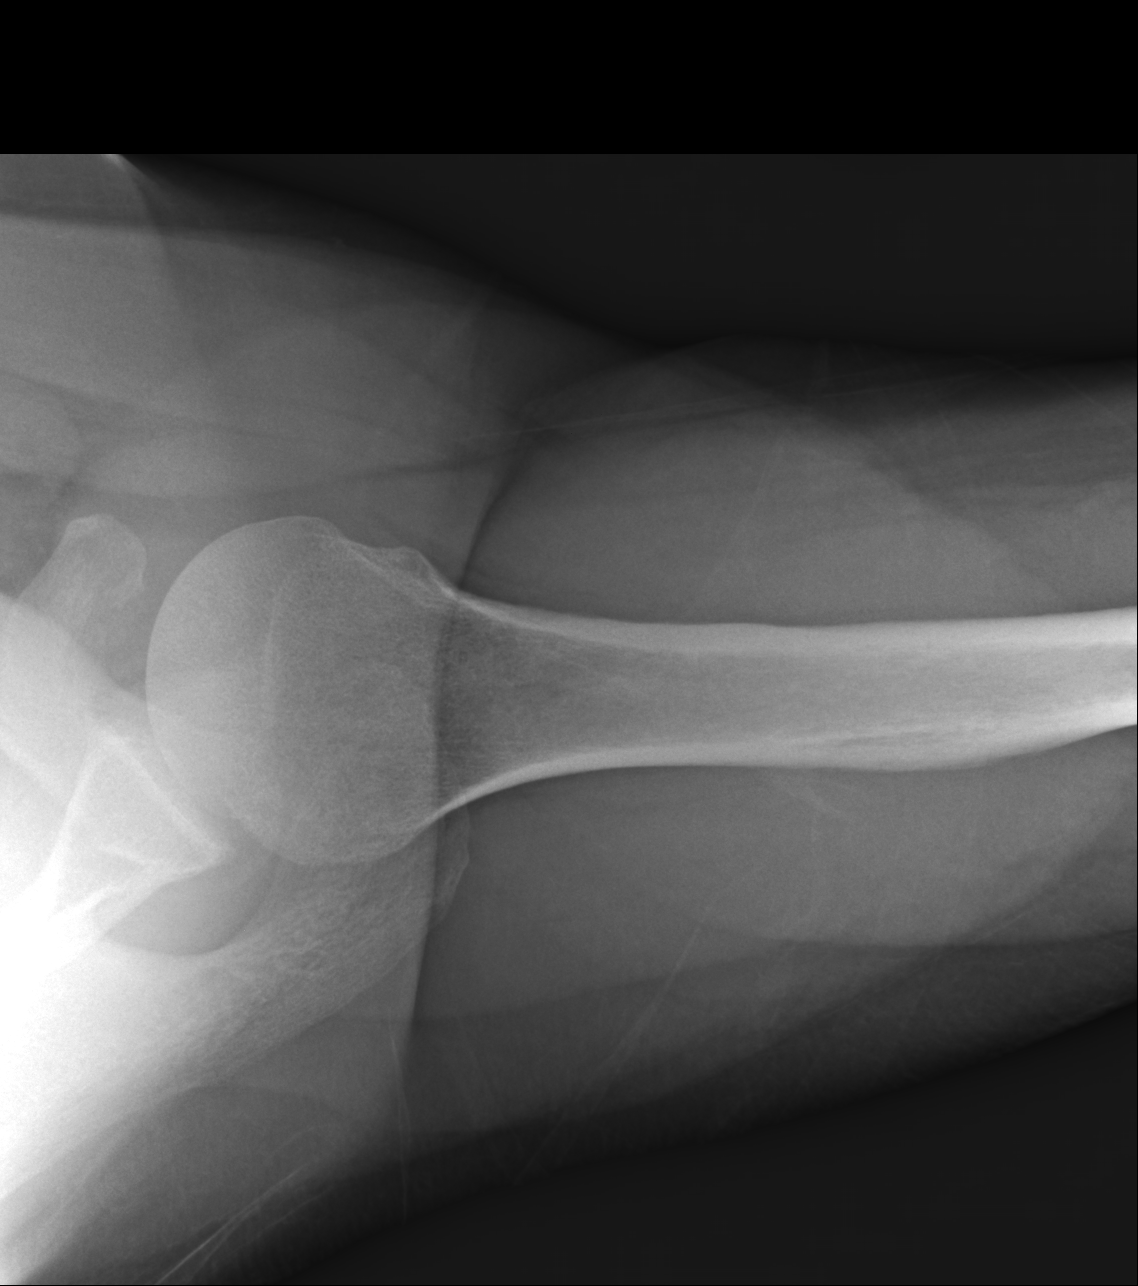

[3 of 3 positions shown; findings below may reference images not displayed]

TECHNIQUE AND FINDINGS:
Three views of the left shoulder show normal alignment of the glenohumeral joint. There is widening of the AC joint with otherwise smooth margins. There is no fracture or subluxation seen. The soft
tissues are unremarkable.
IMPRESSION: Apparent postsurgical changes left AC joint.

## 2022-06-11 IMAGING — CR TIBIA-FIBULA RT 2 VWS
1 series · 2 of 2 positions shown · non-contrast
Comparison: None.

Images Obtained from [facility]
REASON FOR EXAM: Pain in right knee. Injury of muscle and tendons, lower leg level, right leg.

[Series 1: lat · 0.17mm/px · 2 of 2 slices shown]
[im 1/2]
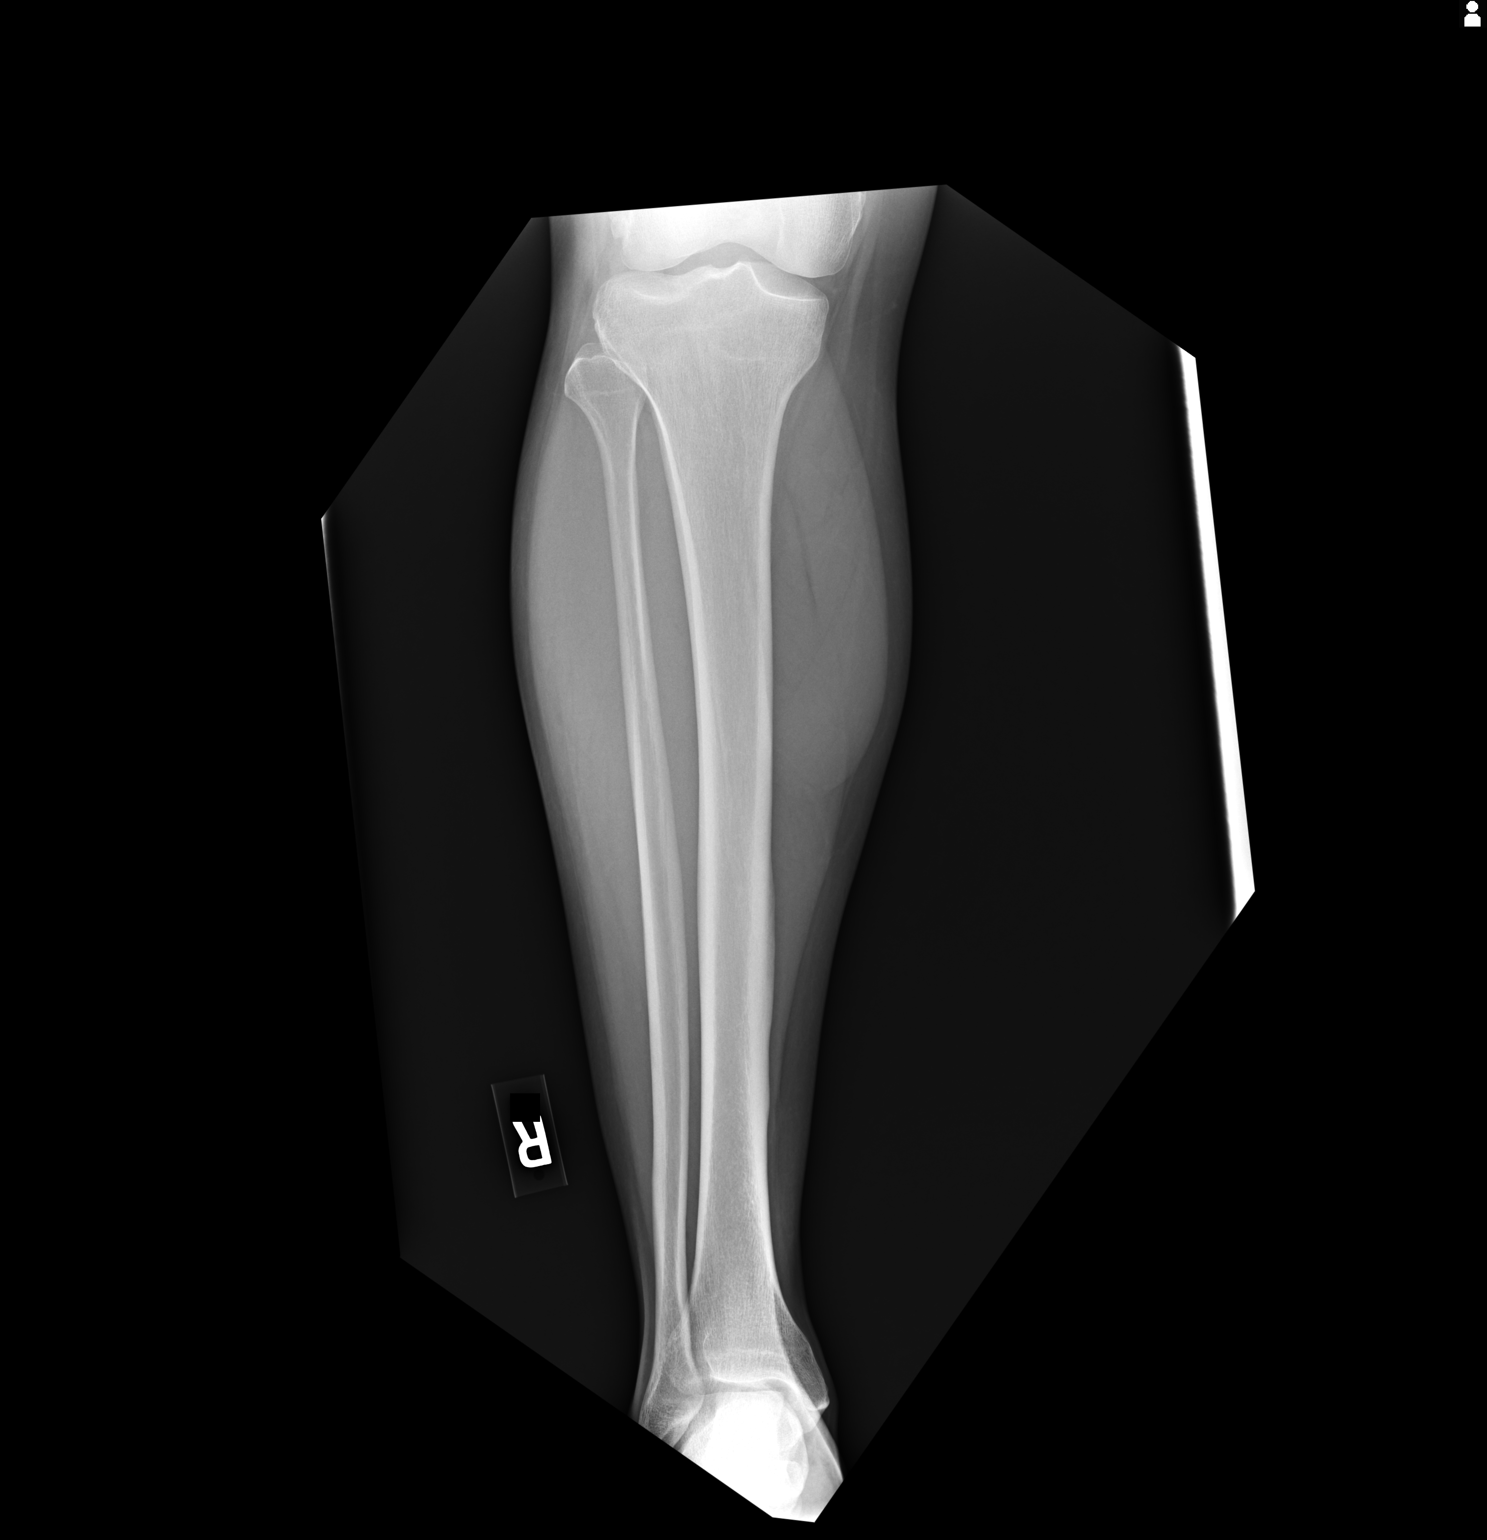
[im 2/2]
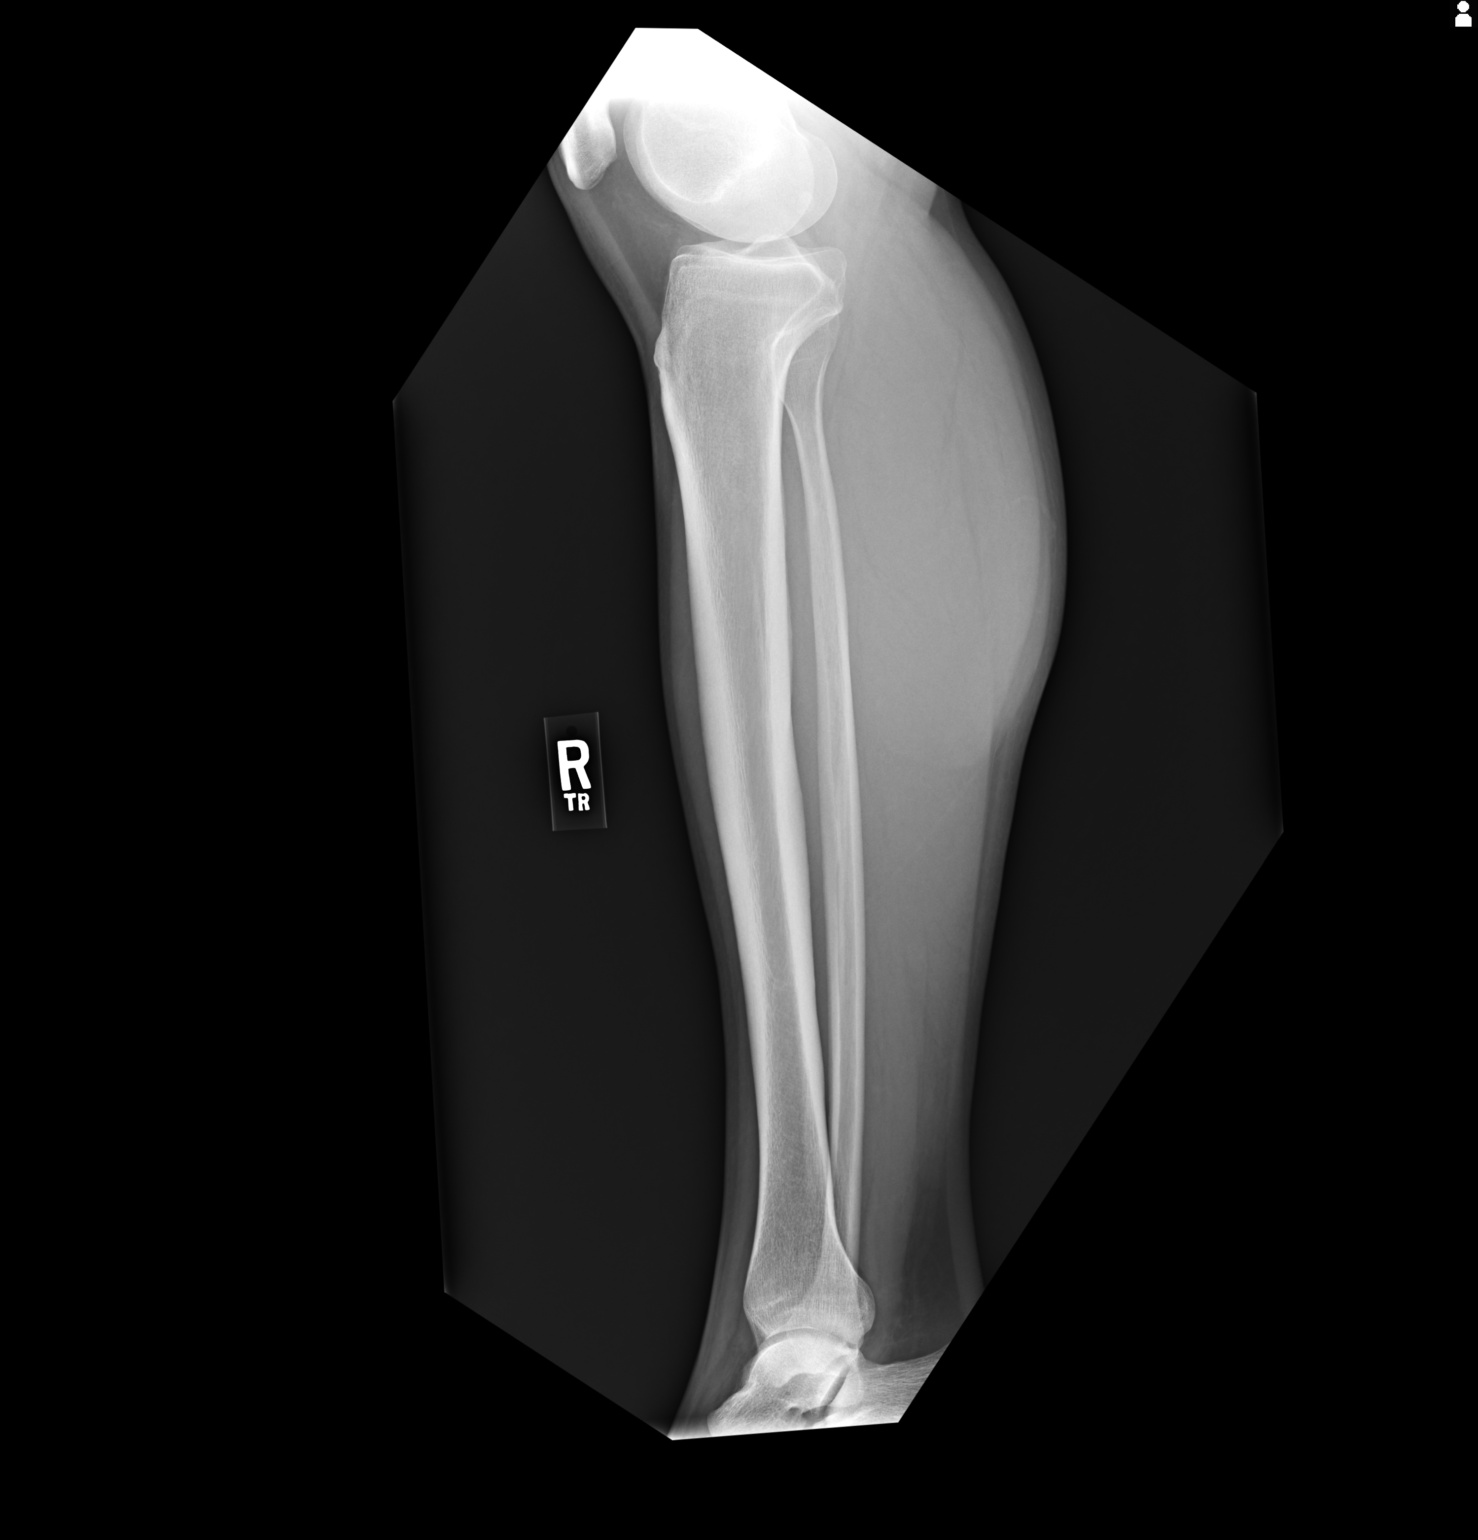

[2 of 2 positions shown; findings below may reference images not displayed]

FINDINGS: Two views of the right tib-fib show no evidence of fracture, dislocation or radiopaque foreign body. Soft tissues are unremarkable.
IMPRESSION: Negative exam.

## 2022-06-11 IMAGING — CR SHOULDER RT 2 VWS MIN
1 series · 3 of 3 positions shown · non-contrast
Comparison: MRI [id number]
TECHNIQUE AND FINDINGS: Three views of the right shoulder show normal alignment of the glenohumeral joint.

Images Obtained from [facility]
REASON FOR EXAM: Arthritis

[Series 1: external rotate · 0.17mm/px · 3 of 3 slices shown]
[im 1/3]
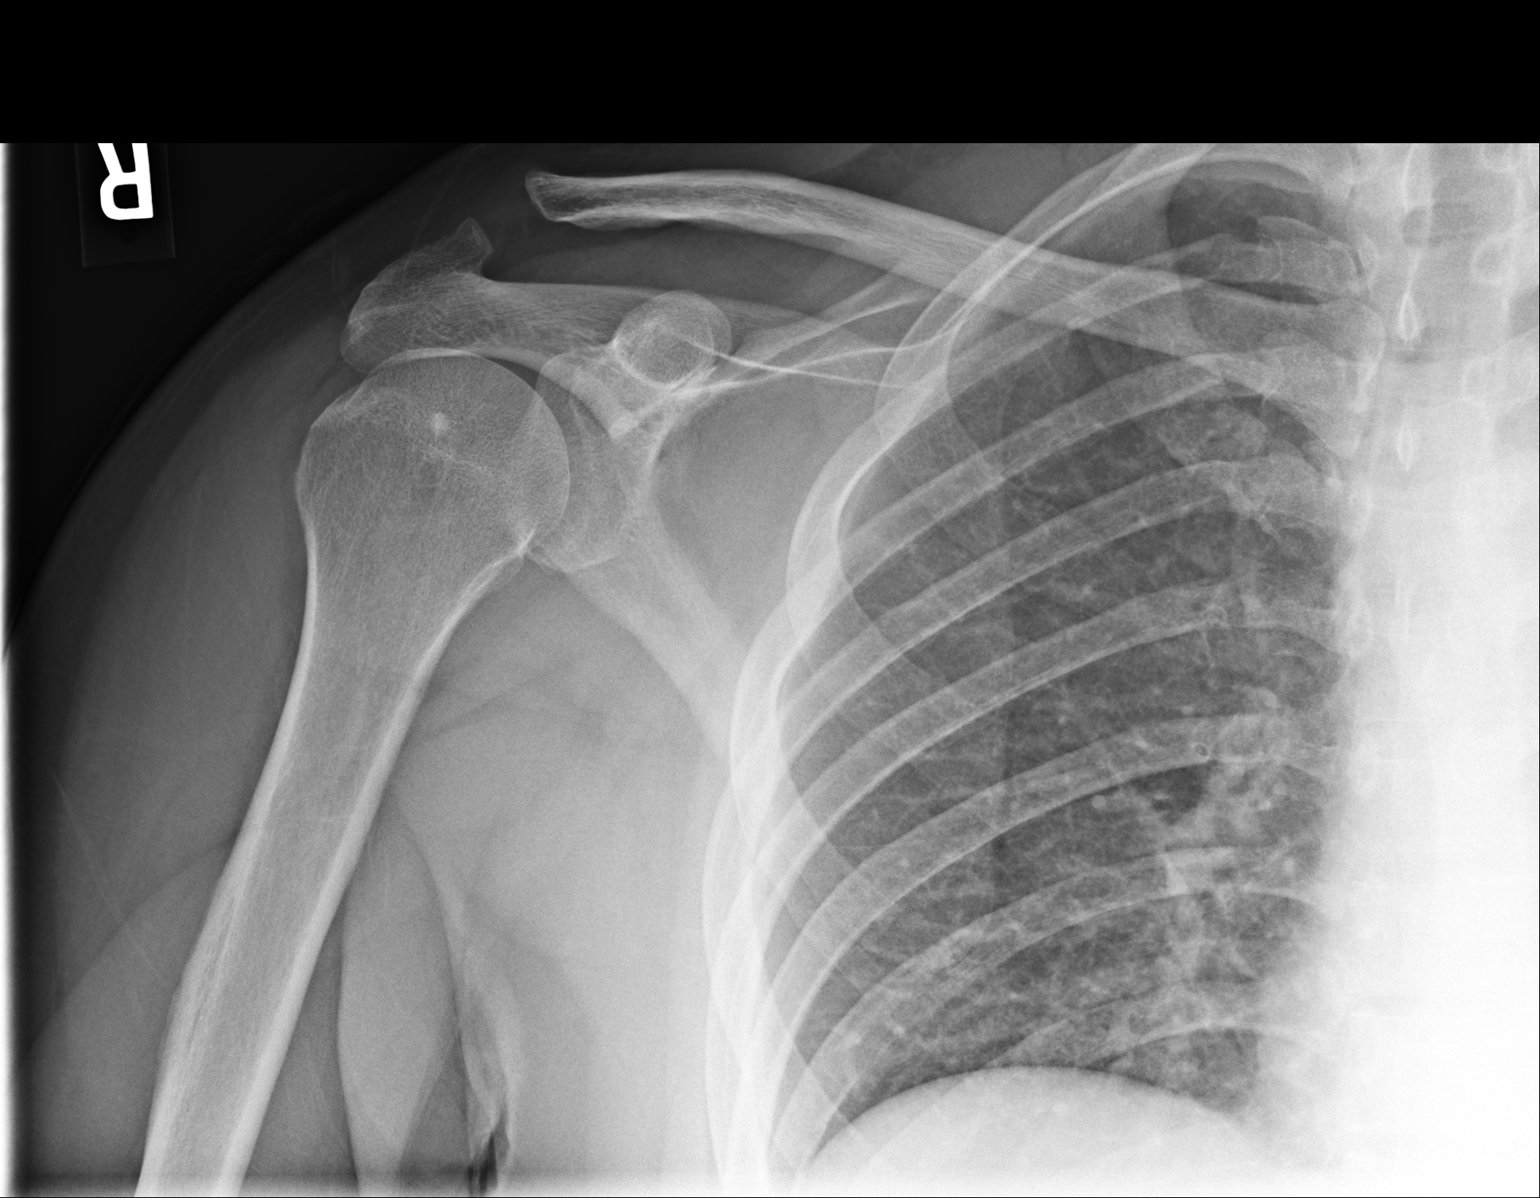
[im 2/3]
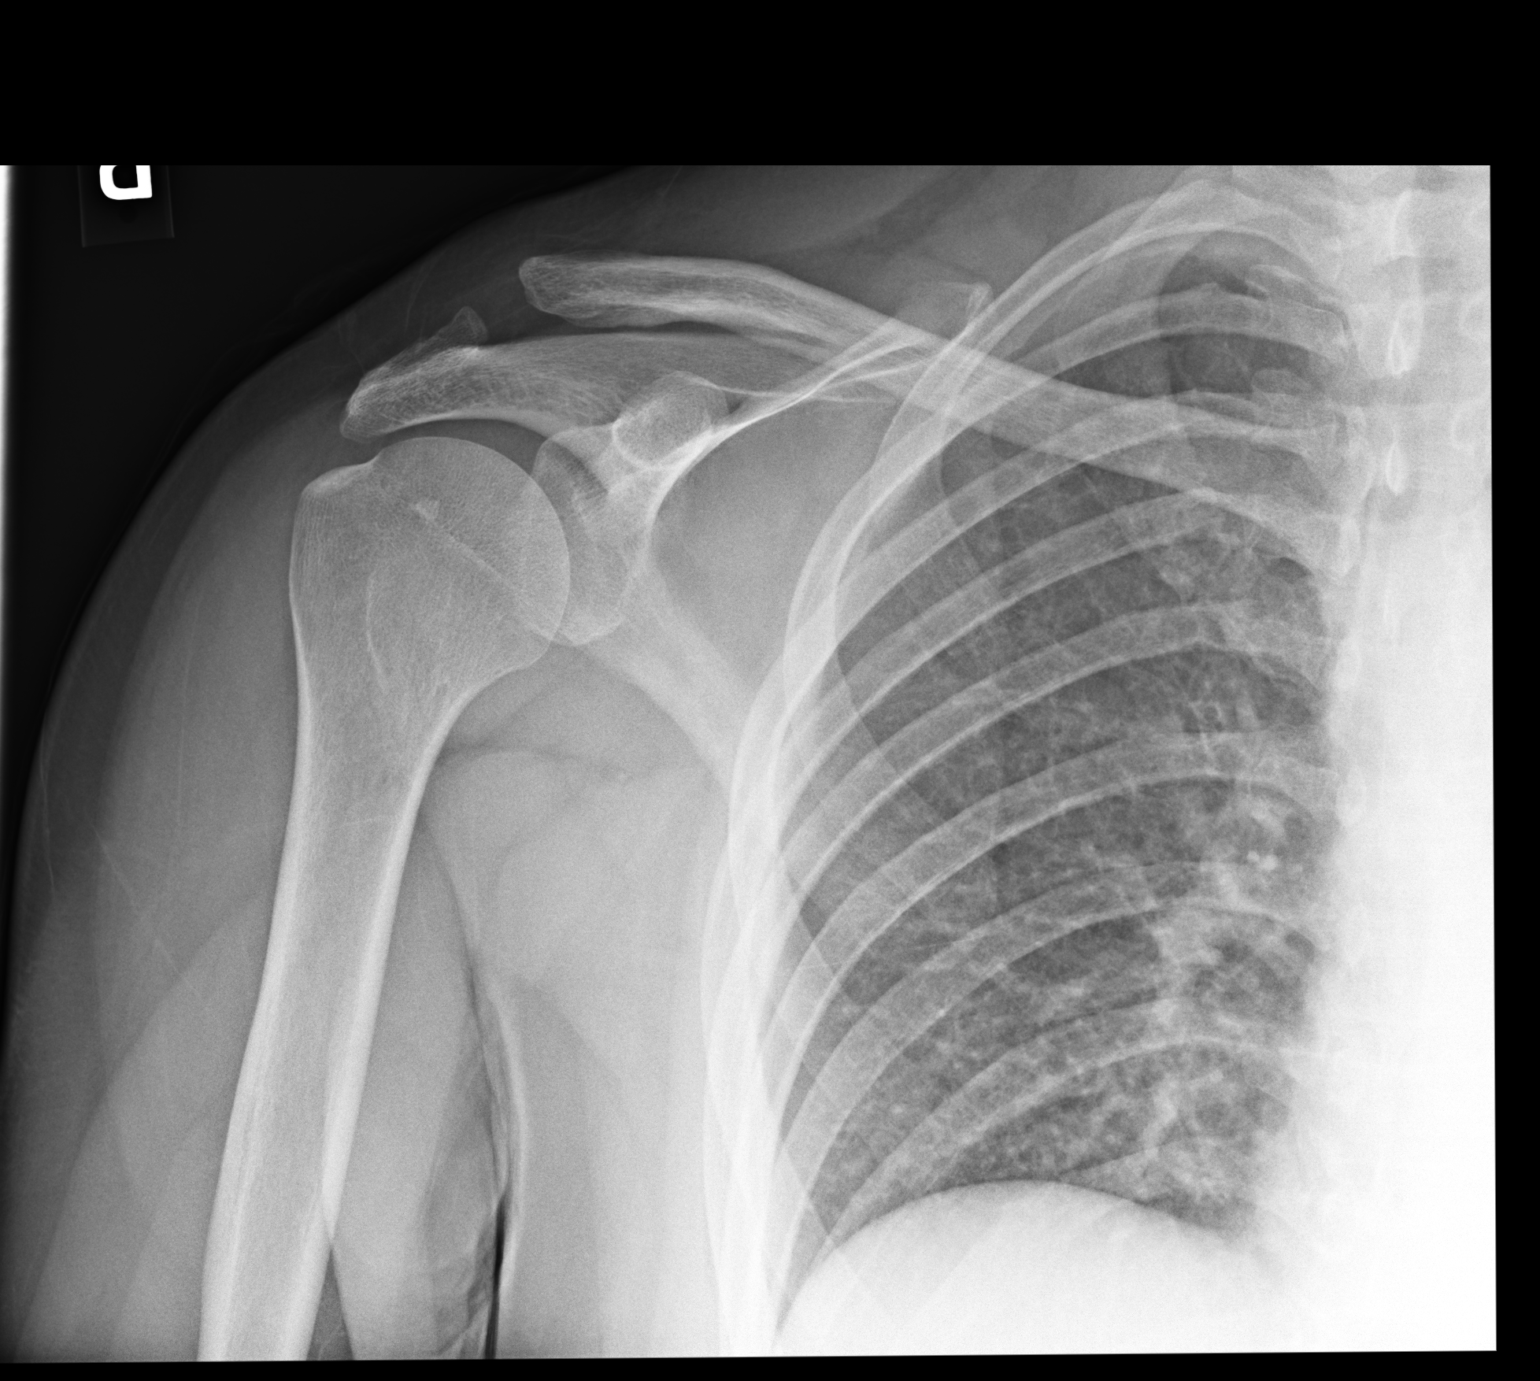
[im 3/3]
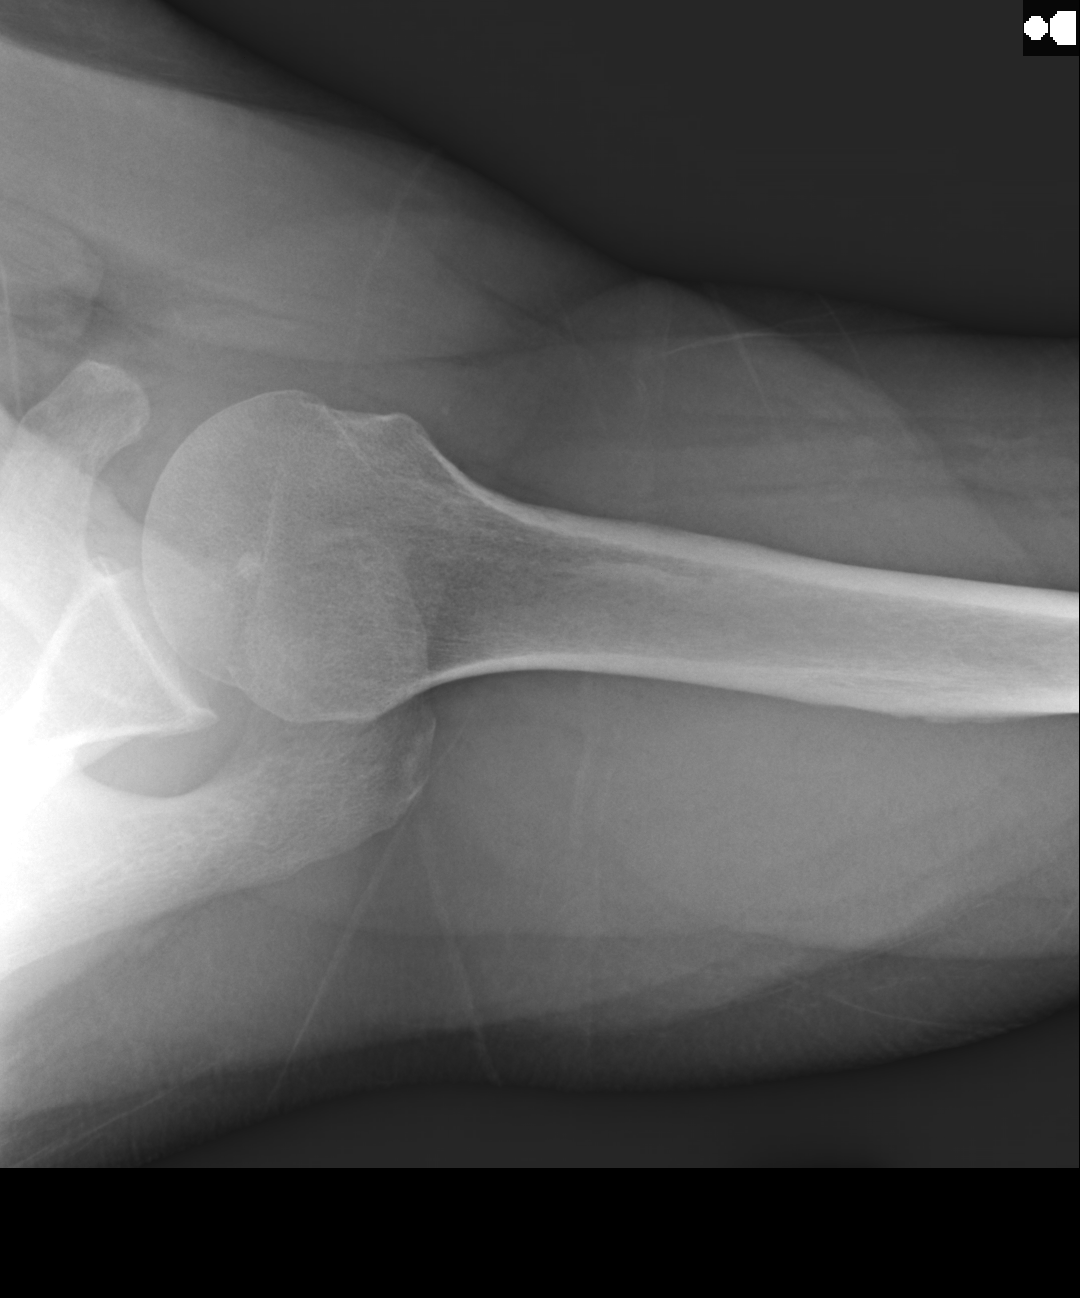

[3 of 3 positions shown; findings below may reference images not displayed]

There is widening of the AC joint with otherwise smooth margins. There is no fracture or
subluxation seen. The soft tissues are unremarkable.
IMPRESSION: Probable postsurgical widening right AC joint.
